# Patient Record
Sex: Male | Born: 1980 | Race: White | Hispanic: No | Smoking: Current every day smoker
Health system: Southern US, Community
[De-identification: ages and names within clinical notes are randomized; demographics above are authoritative.]

## PROBLEM LIST (undated history)

## (undated) DIAGNOSIS — G009 Bacterial meningitis, unspecified: Secondary | ICD-10-CM

---

## 2006-03-25 ENCOUNTER — Emergency Department (HOSPITAL_COMMUNITY): Admission: EM | Admit: 2006-03-25 | Discharge: 2006-03-25 | Payer: Self-pay | Admitting: Emergency Medicine

## 2008-07-04 ENCOUNTER — Inpatient Hospital Stay (HOSPITAL_COMMUNITY): Admission: EM | Admit: 2008-07-04 | Discharge: 2008-07-07 | Payer: Self-pay | Admitting: Emergency Medicine

## 2008-07-05 ENCOUNTER — Ambulatory Visit: Payer: Self-pay | Admitting: Infectious Disease

## 2009-11-03 ENCOUNTER — Inpatient Hospital Stay (HOSPITAL_COMMUNITY): Admission: EM | Admit: 2009-11-03 | Discharge: 2009-11-05 | Payer: Self-pay | Admitting: Emergency Medicine

## 2010-01-25 ENCOUNTER — Emergency Department (HOSPITAL_BASED_OUTPATIENT_CLINIC_OR_DEPARTMENT_OTHER)
Admission: EM | Admit: 2010-01-25 | Discharge: 2010-01-25 | Payer: Self-pay | Source: Home / Self Care | Admitting: Emergency Medicine

## 2010-04-14 LAB — URINALYSIS, ROUTINE W REFLEX MICROSCOPIC
Bilirubin Urine: NEGATIVE
Hgb urine dipstick: NEGATIVE
Nitrite: NEGATIVE
Protein, ur: NEGATIVE mg/dL
Specific Gravity, Urine: 1.019 (ref 1.005–1.030)
Urobilinogen, UA: 1 mg/dL (ref 0.0–1.0)

## 2010-04-14 LAB — URINE CULTURE
Colony Count: NO GROWTH
Culture: NO GROWTH
Special Requests: NEGATIVE

## 2010-04-14 LAB — COMPREHENSIVE METABOLIC PANEL
AST: 13 U/L (ref 0–37)
AST: 23 U/L (ref 0–37)
Albumin: 3.7 g/dL (ref 3.5–5.2)
BUN: 12 mg/dL (ref 6–23)
BUN: 9 mg/dL (ref 6–23)
CO2: 25 mEq/L (ref 19–32)
Calcium: 8.9 mg/dL (ref 8.4–10.5)
Chloride: 109 mEq/L (ref 96–112)
Creatinine, Ser: 0.68 mg/dL (ref 0.4–1.5)
Creatinine, Ser: 0.71 mg/dL (ref 0.4–1.5)
GFR calc Af Amer: >60 mL/min (ref >60)
GFR calc Af Amer: >60 mL/min (ref >60)
GFR calc non Af Amer: >60 mL/min (ref >60)
Glucose, Bld: 180 mg/dL — ABNORMAL HIGH (ref 70–99)
Total Bilirubin: 0.6 mg/dL (ref 0.3–1.2)
Total Protein: 6.4 g/dL (ref 6.0–8.3)

## 2010-04-14 LAB — BASIC METABOLIC PANEL
CO2: 25 mEq/L (ref 19–32)
Chloride: 109 mEq/L (ref 96–112)
Creatinine, Ser: 0.75 mg/dL (ref 0.4–1.5)
GFR calc Af Amer: >60 mL/min (ref >60)
Potassium: 3.9 mEq/L (ref 3.5–5.1)
Sodium: 140 mEq/L (ref 135–145)

## 2010-04-14 LAB — GRAM STAIN

## 2010-04-14 LAB — CULTURE, BLOOD (ROUTINE X 2)
Culture  Setup Time: 201110052138
Culture  Setup Time: 201110052139
Culture: NO GROWTH

## 2010-04-14 LAB — CSF CELL COUNT WITH DIFFERENTIAL: Segmented Neutrophils-CSF: 8 % — ABNORMAL HIGH (ref 0–6)

## 2010-04-14 LAB — CBC
HCT: 43.2 % (ref 39.0–52.0)
HCT: 47.2 % (ref 39.0–52.0)
Hemoglobin: 15.1 g/dL (ref 13.0–17.0)
Hemoglobin: 16.5 g/dL (ref 13.0–17.0)
MCH: 32.6 pg (ref 26.0–34.0)
MCH: 32.7 pg (ref 26.0–34.0)
MCH: 32.8 pg (ref 26.0–34.0)
MCV: 92.9 fL (ref 78.0–100.0)
MCV: 93.4 fL (ref 78.0–100.0)
MCV: 93.6 fL (ref 78.0–100.0)
Platelets: 231 10*3/uL (ref 150–400)
RBC: 4.63 MIL/uL (ref 4.22–5.81)
RBC: 5.08 MIL/uL (ref 4.22–5.81)
RDW: 13.6 % (ref 11.5–15.5)
WBC: 9.8 10*3/uL (ref 4.0–10.5)

## 2010-04-14 LAB — DIFFERENTIAL
Basophils Absolute: 0.1 10*3/uL (ref 0.0–0.1)
Eosinophils Absolute: 0 10*3/uL (ref 0.0–0.7)
Eosinophils Relative: 0 % (ref 0–5)
Eosinophils Relative: 0 % (ref 0–5)
Lymphocytes Relative: 10 % — ABNORMAL LOW (ref 12–46)
Lymphocytes Relative: 7 % — ABNORMAL LOW (ref 12–46)
Lymphs Abs: 1 10*3/uL (ref 0.7–4.0)
Monocytes Absolute: 0 10*3/uL — ABNORMAL LOW (ref 0.1–1.0)
Monocytes Relative: 0 % — ABNORMAL LOW (ref 3–12)
Neutrophils Relative %: 92 % — ABNORMAL HIGH (ref 43–77)

## 2010-04-14 LAB — CSF CULTURE W GRAM STAIN

## 2010-04-14 LAB — PROTEIN AND GLUCOSE, CSF: Glucose, CSF: 51 mg/dL (ref 43–76)

## 2010-04-14 LAB — MAGNESIUM: Magnesium: 2.4 mg/dL (ref 1.5–2.5)

## 2010-05-09 LAB — CULTURE, BLOOD (ROUTINE X 2): Culture: NO GROWTH

## 2010-05-09 LAB — BASIC METABOLIC PANEL
BUN: 9 mg/dL (ref 6–23)
CO2: 27 mEq/L (ref 19–32)
Chloride: 103 mEq/L (ref 96–112)
GFR calc Af Amer: >60 mL/min (ref >60)
GFR calc non Af Amer: >60 mL/min (ref >60)
Glucose, Bld: 109 mg/dL — ABNORMAL HIGH (ref 70–99)
Glucose, Bld: 95 mg/dL (ref 70–99)
Potassium: 3.6 mEq/L (ref 3.5–5.1)
Potassium: 4.3 mEq/L (ref 3.5–5.1)
Sodium: 138 mEq/L (ref 135–145)

## 2010-05-09 LAB — CBC
HCT: 40 % (ref 39.0–52.0)
Hemoglobin: 14 g/dL (ref 13.0–17.0)
MCHC: 34 g/dL (ref 30.0–36.0)
MCHC: 34.9 g/dL (ref 30.0–36.0)
Platelets: 243 10*3/uL (ref 150–400)
RBC: 4.35 MIL/uL (ref 4.22–5.81)
RBC: 5.05 MIL/uL (ref 4.22–5.81)
RDW: 13.2 % (ref 11.5–15.5)

## 2010-05-09 LAB — DIFFERENTIAL
Basophils Absolute: 0 10*3/uL (ref 0.0–0.1)
Basophils Relative: 0 % (ref 0–1)
Monocytes Relative: 5 % (ref 3–12)
Neutro Abs: 10.6 10*3/uL — ABNORMAL HIGH (ref 1.7–7.7)
Neutrophils Relative %: 82 % — ABNORMAL HIGH (ref 43–77)

## 2010-05-09 LAB — CSF CELL COUNT WITH DIFFERENTIAL
RBC Count, CSF: 118 /mm3 — ABNORMAL HIGH
WBC, CSF: 643 /mm3 — ABNORMAL HIGH (ref 0–5)

## 2010-05-09 LAB — PROTEIN AND GLUCOSE, CSF
Glucose, CSF: 56 mg/dL (ref 43–76)
Total  Protein, CSF: 158 mg/dL — ABNORMAL HIGH (ref 15–45)

## 2010-05-09 LAB — BODY FLUID CULTURE

## 2010-05-09 LAB — URINALYSIS, ROUTINE W REFLEX MICROSCOPIC
Nitrite: NEGATIVE
Specific Gravity, Urine: 1.027 (ref 1.005–1.030)
Urobilinogen, UA: 1 mg/dL (ref 0.0–1.0)

## 2010-06-14 NOTE — H&P (Signed)
NAMEZEREK, LITSEY NO.:  [id number]   MEDICAL RECORD NO.:  [id number]          PATIENT TYPE:  INP   LOCATION:  0108                         FACILITY:  [facility]   PHYSICIAN:  Della Goo, M.D. DATE OF BIRTH:  Dec 14, 1980   DATE OF ADMISSION:  07/04/2008  DATE OF DISCHARGE:                              HISTORY & PHYSICAL   PRIMARY CARE PHYSICIAN:  Unassigned.   CHIEF COMPLAINT:  Headache, fever.   HISTORY OF PRESENT ILLNESS:  This is a 30 year old male who presents to  the emergency department with complaints of severe headache which  started 1 day ago in the a.m.  The patient reports having a severe  constant headache and also having nausea, vomiting and neck stiffness.  The patient reports having fevers, chills.  He reports that the symptoms  are similar to when he had spinal meningitis in February 2008.  He  stated at that time this was bacterial meningitis and he had a rash.  Currently he denies having any rash.   The patient was evaluated in the emergency department and the EDP  performed a lumbar puncture, results of which returned most consistent  with a viral meningitis.  The patient was started on therapy to cover  both bacterial and viral meningitis, and then he was referred for  admission.   PAST MEDICAL HISTORY:  As mentioned above.  Also history of  gastroesophageal reflux disease.   PAST SURGICAL HISTORY:  None.   MEDICATIONS:  Over-the-counter medications.   ALLERGIES:  No known drug allergies.   SOCIAL HISTORY:  The patient is a smoker.  He smokes 1 pack of  cigarettes daily for 10 years.  He also drinks occasionally.  He states  he drinks 8-9 beers once a week.  He denies any illicit drug usage.   FAMILY HISTORY:  Noncontributory.   REVIEW OF SYSTEMS:  Pertinents are mentioned above.  All other organ  systems are negative.   PHYSICAL EXAMINATION FINDINGS:  GENERAL APPEARANCE: This is a 28-year-  old morbidly obese male in  discomfort but no acute distress.  VITAL  SIGNS:  Temperature 99.6, blood pressure 149/103, heart rate 86,  respirations 16, O2 saturations 98%.  HEENT: Normocephalic, atraumatic.  Pupils equally round and reactive to  light.  Extraocular movements are intact. Funduscopic benign.  There is  no scleral icterus.  Nares are patent bilaterally.  Oropharynx is clear.  Neck is supple with full range of motion.  No thyromegaly, adenopathy or  jugular venous distention.  No meningismus on examination.  CARDIOVASCULAR:  Regular rate and rhythm.  No murmurs, gallops or rubs.  LUNGS: Clear to auscultation bilaterally.  ABDOMEN: Positive bowel sounds, soft, nontender, nondistended.  No  hepatosplenomegaly.  EXTREMITIES: Without cyanosis, clubbing or edema.  NEUROLOGIC:  The patient is alert and oriented x3.  Cranial nerves are  intact.  Motor and sensory function also intact.  The patient has 5/5  strength and full range of motion throughout.   LABORATORY STUDIES:  White blood cell count 12.9, hemoglobin 16,  hematocrit 46.9, platelets 243, neutrophils 82%, lymphocytes 13%.  Sodium 136, potassium 3.6, chloride 103, carbon dioxide 24, BUN 9,  creatinine 0.86 and glucose 109.  Urinalysis reveals small leukocyte  esterase and CSF fluid reveals an appearance which is clear.  The RBCs  are 118.  White blood cells are 643, neutrophils 0%, lymphocytes and  99%. Total CSF protein 158.  CSF glucose level 56.   ASSESSMENT:  This is a 30 year old male being admitted with  1. Viral meningitis.  2. Severe cephalgia.  3. Febrile illness secondary viral meningitis.  4. Nausea and vomiting secondary viral meningitis.   PLAN:  The patient will be admitted to an isolation area.  He has been  placed on empiric antibiotic and antiviral medication therapy to cover  both viral and bacterial meningitis at this time.  The patient will also  be placed on IV fluids.  Antiemetics and pain control therapy have been   ordered.  The patient will be placed on a clear liquid diet for now and  this will advance as tolerated.  The patient will also be placed on DVT  and GI prophylaxis and further workup will ensue pending results of the  patient's clinical course.      Della Goo, M.D.  Electronically Signed     HJ/MEDQ  D:  07/05/2008  T:  07/05/2008  Job:  [id number]

## 2010-06-14 NOTE — Consult Note (Signed)
NAMEHAITHAM, [name]:  [id number]   MEDICAL RECORD NO.:  [id number]          PATIENT TYPE:  INP   LOCATION:  1504                         FACILITY:  [facility]   PHYSICIAN:  Lee Lav, MD  DATE OF BIRTH:  [birth year]   DATE OF CONSULTATION:  07/05/2008  DATE OF DISCHARGE:                                 CONSULTATION   REQUESTING PHYSICIAN:  Lee Anderson, M.D.   REASON FOR INFECTIOUS DISEASE CONSULTATION:  Lymphocytic meningitis.   HISTORY OF PRESENT ILLNESS:  Lee Anderson is a 30 year old Caucasian  gentleman with a past medical history significant for apparent aseptic  meningitis in 2008.  During that year he apparently had broken out with  genital herpes and was admitted with severe headaches and meningismus to  [facility].  He states that he was diagnosed with  spinal meningitis at that point in time.  He has had recurrent flares  of genital herpes at least five times since then.  His wife also  subsequently in this same time period had developed genital herpes as  well.  She takes chronic Valtrex for suppression.  He has tried to take  acyclovir for treatment but has not been able to take it for suppression  due to lack of insurance and cost.  He presented to the emergency  department on June 5 with severe headaches, stiff neck, nausea, vomiting  with bilious vomiting with fevers and chills.  He felt these symptoms  were highly similar to that which he experienced when he developed  spinal meningitis.  The patient was evaluated in the emergency  department, underwent lumbar puncture.  His lumbar puncture revealed a  cell count showing 643 white blood cells, 118 red blood cells with 99%  lymphocytes, one monocyte and no neutrophils.  Protein was 158, glucose  was 56.  Gram stain does not appear to have been done.  Culture is in  progress.  I have called the microbiology lab to try and see what the  status of this is.  Urinalysis  showed 3-6 white blood cells, 0-2 red  blood cells.  The remainder of his labs were significant also for a  white blood cell count of 12,900, hematocrit 16, platelets of 243.  The  patient was admitted to the hospitalist service and started on  vancomycin, ceftriaxone and acyclovir.   SOCIAL HISTORY:  Significant for him being married, for him having had  the prior herpes infection.  He lives at home with his wife and child  who is two years old.  The child has not been ill nor has the patient  had any other ill contacts.  The patient's travel history is significant  only for travel on the [facility].  He has never been exposed to  anyone with tuberculosis.   PAST MEDICAL HISTORY:  Gastroesophageal reflux disease which he has been  managed with over-the-counter Prilosec.  Also has responded to Pepcid.   PAST SURGICAL HISTORY:  None.   FAMILY HISTORY:  Noncontributory.   SOCIAL HISTORY:  The patient smokes one pack per day.  He drinks  occasionally.  He has used marijuana but no other illicit drug use.   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  Chest pain is substernal accompanied by  dyspnea, not by nausea, does not radiate.  Chest pain dissipates  typically in 5 minutes.  He has had several of these in the last several  weeks and months but has not had it this week or during this admission.  Pertinent for he fact that he has had recent fevers and chills,  headaches as described in the paragraph above.  He has not had chronic  fevers or weight loss or fevers or chills.  The remainder of 10-point  review of systems is negative.   ALLERGIES:  No known drug allergies.   CURRENT MEDICATIONS:  1. Acyclovir 820 mg every 8 hours.  2. Ceftriaxone 2 grams every 12 hours.  3. Vancomycin 1750 mg every 8 hours.  4. Lovenox, pantoprazole, senna, docusate and  Tylenol, Bisodol,      Dilaudid, Zofran, oxycodone.   PHYSICAL EXAMINATION:  Temperature maximum overnight 99.4, blood  pressure 143/74, pulse 82, respirations 20, pulse ox 90% room air.  Weight 122 kg.  GENERAL:  Quite pleasant gentleman lying in bed.  He has just received  another dose of Dilaudid.  HEENT:  Normocephalic, atraumatic.  Pupils equal, round, reactive to  light.  Sclerae anicteric.  Oropharynx clear.  CARDIOVASCULAR EXAM:  Regular rate and rhythm.  No murmurs, gallops or  rubs.  LUNGS:  Clear to auscultation bilaterally without wheeze, rhonchi or  rales.  ABDOMEN:  Soft, nondistended, nontender.  EXTREMITIES:  Without edema  NEUROLOGIC EXAM:  Nonfocal.  The patient has no evidence of meningismus  at this point time.   LABORATORY DATA:  Spinal fluid analysis as described above.  White blood  cell count 12.9, hemoglobin 16, platelets 243.  Metabolic panel:  Sodium  136, potassium 3.6, chloride 103, bicarb 24.  BUN and creatinine of 9  and 0.86, glucose 109.   IMPRESSION/RECOMMENDATIONS:  This is a 30 year old Caucasian gentleman  past medical history significant for gastroesophageal reflux disease,  genital herpes and past apparent aseptic meningitis in 2008 now with an  apparent recurrence of aseptic meningitis with the patient having a  lymphocytic predominant meningitis.  1. Lymphocytic meningitis:  Differential for lymphocytic meningitis is      largely viral.  Highest in the differential would be herpes simplex      type 2 meningitis.  The patient has no evidence of encephalitis and      herpes simplex type 1 is less likely.  Additionally enterovirus      could cause aseptic meningitis with lymphocytic predominance as      could West Nile wires.  Tuberculous meningitis can cause an aseptic      meningitis, although his clinical picture is not consistent with      this.  Certainly other fungal infections such as Cryptococcus histo      could do it but he is  essentially the wrong host for this (We will      check an HIV test though).  However, syphilis could also do this.      We will check an RPR.  Of the bacterial pathogens that can cause a      lymphocytic predominant spinal fluid profile is Listeria  monocytogenes.  Again the patient seems to be the wrong demographic      for this.  He is young, healthy, at least to the best of our      knowledge and through reviewing his history he does eat food such      as hot dogs that are known to have Listeria but he is not      immunocompromised and seems highly unlikely to have this.  At      present I am going to stop his vancomycin and ceftriaxone.  We will      continue his acyclovir.  I will put him on ampicillin until we can      get more data from the CSF cultures.  It is quite reasonable to      check HSV-2 from his spinal fluid.  I would check an RPR on his      blood and an HIV test.  If the patient's spinal fluid cultures      remain negative at 24 hours after admission I will discontinue his      droplet precautions (The possibility of Meningococcal meningitis is      next to nothing.).  Should his spinal fluid cultures remain      negative, I would discontinue all antibacterial antibiotics.  Once      his nausea is controlled, I would put him on Valtrex at 1 gram      three times daily and complete a 7-10 day course of Valtrex.  At      that point in time I would put him on suppressive acyclovir which      could be taken at 400 mg twice daily.  Note there is controversy as      to whether suppressive acyclovir or Valtrex is useful for recurrent      Mallory's meningitis.  However, this patient also has had history      of recurrent genital herpes infections and I feel that this would      be beneficial to him to take prophylactic acyclovir.  2. Chest pain.  I will check an electrocardiogram.  He does not have      current chest pain but I feel it is  worthwhile to check an       electrogram and I will let Dr. Darnelle Catalan know about this.  3. Proton pump inhibitor use.  The patient has gastroesophageal reflux      disease that has responded to proton pump inhibitors as an      outpatient.  His ulcer responded to Pepcid.  Pepcid would be      cheaper for the patient and also reduce the risk of him acquiring a      healthcare associated pneumonia with initiation of a proton pump      inhibitor in the hospital.  4. Thank you for this fascinating consultation.  Dr. Orvan Falconer will be      here tomorrow to see the patient again.      Lee Lav, MD  Electronically Signed     CV/MEDQ  D:  07/05/2008  T:  07/05/2008  Job:  [id number]

## 2010-06-14 NOTE — Discharge Summary (Signed)
Lee Anderson, KROENING NO.:  [id number]   MEDICAL RECORD NO.:  [id number]          PATIENT TYPE:  INP   LOCATION:  1504                         FACILITY:  [facility]   PHYSICIAN:  Lee Anderson, M.D.   DATE OF BIRTH:  January 21, 1981   DATE OF ADMISSION:  07/04/2008  DATE OF DISCHARGE:  07/07/2008                               DISCHARGE SUMMARY   PRIMARY CARE PHYSICIAN:  None.   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 Intractable headache secondary to number one.  3. Gastroesophageal reflux disease.  4. Genital herpes.   DISCHARGE MEDICATIONS:  1. Valtrex 1 gram p.o. t.i.d. times 1 week.  2. Percocet 10/625 one tablet q.4 hours p.r.n. headache.  3. Phenergan 25 mg p.o. q.4 hours p.r.n. nausea.  4. Ibuprofen 400 mg p.o. q.4 hours p.r.n. headache.   CONSULTATIONS:  Dr. Daiva Eves and Dr. Orvan Falconer of infectious diseases.   BRIEF ADMISSION HISTORY OF PRESENT ILLNESS:  The patient is a 28-year-  old male with past medical history of herpes simplex virus type 2  meningitis treated in January 2008.  Hospital records were obtained from  [facility] and this history was confirmed.  The patient  presented to the hospital with chief complaint of fever and headaches.  The patient was admitted with a diagnosis of acute meningitis and  hospital service was subsequently called for management.  For full  details, please see the dictated report done by Dr. Lovell Sheehan.   PROCEDURES AND DIAGNOSTIC STUDIES:  Lumbar puncture done on admission  revealed a white blood cell count of 643, red blood cells of 118 and 99%  lymphocytes.  Color was clear.  Protein was elevated at 158.  Glucose  was 56.  Herpes simplex virus II DNA was detected.   DISCHARGE LABORATORY VALUES:  HIV testing was negative.  Syphilis screen  was negative.  CSF cultures have shown no growth x2 days.   HOSPITAL COURSE BY PROBLEM:  1. Recurrent herpes simplex  virus 2 meningitis:  The patient was      admitted and due to concerns of possible bacterial meningitis, was      broadly covered with vancomycin, acyclovir, and Rocephin.      Ampicillin was subsequently added to cover Listeria although this      was considered less likely underlying etiology.  The patient's past      medical history was confirmed when records were obtained from [facility] which confirmed a previous history of herpes      simplex 2 viral meningitis.  The patient underwent a lumbar      puncture which confirmed recurrent herpes simplex virus 2      meningitis.  Once the CSF cultures remained negative for 2 days,      antibiotics were discontinued and he was transitioned over p.o.      Valtrex.  Infectious disease consultation was obtained for      treatment recommendations and ultimately it was recommended that      the patient remain  on Valtrex for 1 more week.  After discussion of      the pros and cons of long-term prophylactic therapy with acyclovir,      the patient declined this due to lack of health care insurance and      resources to pay for prophylactic therapy.  He understands that he      should come to the hospital for recurrence of headaches.  2. Intractable headache:  This is due to problem number one.  The      patient was treated with IV Dilaudid while in the hospital.  He      will be discharged on Percocet for pain control.  3. Gastroesophageal reflux disease:  The patient was initially put on      Protonix.  His therapy was downgraded to Pepcid prior to discharge.      He is to continue to use over-the-counter medications as needed      after discharge.  4. Genital herpes:  Again, the patient will be treated with Valtrex as      noted above.   DISPOSITION:  The patient is medically stable and be discharged home.  He is encouraged to call [phone], the physician's referral, line to  obtain a primary care physician and to follow up  as needed.   Time spent coordinating care for discharge and discharge instructions  equals 25 minutes.      Lee Anderson, M.D.  Electronically Signed     CR/MEDQ  D:  07/07/2008  T:  07/07/2008  Job:  [id number]

## 2010-06-30 ENCOUNTER — Emergency Department (INDEPENDENT_AMBULATORY_CARE_PROVIDER_SITE_OTHER): Payer: Self-pay

## 2010-06-30 ENCOUNTER — Emergency Department (HOSPITAL_BASED_OUTPATIENT_CLINIC_OR_DEPARTMENT_OTHER)
Admission: EM | Admit: 2010-06-30 | Discharge: 2010-07-01 | Disposition: A | Discharge status: Home / Self Care | Payer: Self-pay | Attending: Emergency Medicine | Admitting: Emergency Medicine

## 2010-06-30 DIAGNOSIS — K219 Gastro-esophageal reflux disease without esophagitis: Secondary | ICD-10-CM | POA: Insufficient documentation

## 2010-06-30 DIAGNOSIS — R0789 Other chest pain: Secondary | ICD-10-CM

## 2010-06-30 DIAGNOSIS — W19XXXA Unspecified fall, initial encounter: Secondary | ICD-10-CM

## 2010-06-30 DIAGNOSIS — S2249XA Multiple fractures of ribs, unspecified side, initial encounter for closed fracture: Secondary | ICD-10-CM | POA: Insufficient documentation

## 2010-06-30 DIAGNOSIS — S0003XA Contusion of scalp, initial encounter: Secondary | ICD-10-CM | POA: Insufficient documentation

## 2010-06-30 DIAGNOSIS — M542 Cervicalgia: Secondary | ICD-10-CM

## 2010-06-30 DIAGNOSIS — Y93H9 Activity, other involving exterior property and land maintenance, building and construction: Secondary | ICD-10-CM | POA: Insufficient documentation

## 2010-06-30 DIAGNOSIS — W208XXA Other cause of strike by thrown, projected or falling object, initial encounter: Secondary | ICD-10-CM | POA: Insufficient documentation

## 2010-06-30 DIAGNOSIS — S20219A Contusion of unspecified front wall of thorax, initial encounter: Secondary | ICD-10-CM

## 2010-06-30 MED ORDER — IOHEXOL 300 MG/ML  SOLN
80.0000 mL | Freq: Once | INTRAMUSCULAR | Status: AC | PRN
  Administered 2010-06-30: 80 mL via INTRAVENOUS

## 2010-07-04 ENCOUNTER — Emergency Department (HOSPITAL_BASED_OUTPATIENT_CLINIC_OR_DEPARTMENT_OTHER)
Admission: EM | Admit: 2010-07-04 | Discharge: 2010-07-04 | Disposition: A | Discharge status: Home / Self Care | Payer: Self-pay | Attending: Emergency Medicine | Admitting: Emergency Medicine

## 2010-07-04 DIAGNOSIS — IMO0002 Reserved for concepts with insufficient information to code with codable children: Secondary | ICD-10-CM | POA: Insufficient documentation

## 2010-07-04 DIAGNOSIS — Z760 Encounter for issue of repeat prescription: Secondary | ICD-10-CM | POA: Insufficient documentation

## 2013-01-24 ENCOUNTER — Inpatient Hospital Stay (HOSPITAL_COMMUNITY)
Admission: EM | Admit: 2013-01-24 | Discharge: 2013-01-25 | DRG: 076 | Disposition: A | Discharge status: Home / Self Care | Payer: Self-pay | Attending: Internal Medicine | Admitting: Internal Medicine

## 2013-01-24 ENCOUNTER — Encounter (HOSPITAL_COMMUNITY): Payer: Self-pay | Admitting: Emergency Medicine

## 2013-01-24 DIAGNOSIS — G032 Benign recurrent meningitis [Mollaret]: Secondary | ICD-10-CM | POA: Diagnosis present

## 2013-01-24 DIAGNOSIS — Z8661 Personal history of infections of the central nervous system: Secondary | ICD-10-CM

## 2013-01-24 DIAGNOSIS — B003 Herpesviral meningitis: Principal | ICD-10-CM | POA: Diagnosis present

## 2013-01-24 DIAGNOSIS — A6000 Herpesviral infection of urogenital system, unspecified: Secondary | ICD-10-CM | POA: Diagnosis present

## 2013-01-24 DIAGNOSIS — Z791 Long term (current) use of non-steroidal anti-inflammatories (NSAID): Secondary | ICD-10-CM

## 2013-01-24 DIAGNOSIS — R519 Headache, unspecified: Secondary | ICD-10-CM

## 2013-01-24 DIAGNOSIS — R51 Headache: Secondary | ICD-10-CM

## 2013-01-24 DIAGNOSIS — G039 Meningitis, unspecified: Secondary | ICD-10-CM

## 2013-01-24 DIAGNOSIS — F17200 Nicotine dependence, unspecified, uncomplicated: Secondary | ICD-10-CM | POA: Diagnosis present

## 2013-01-24 HISTORY — DX: Bacterial meningitis, unspecified: G00.9

## 2013-01-24 LAB — BASIC METABOLIC PANEL
BUN: 9 mg/dL (ref 6–23)
Chloride: 100 mEq/L (ref 96–112)
Creatinine, Ser: 0.82 mg/dL (ref 0.50–1.35)
GFR calc Af Amer: >90 mL/min (ref >90)
Glucose, Bld: 119 mg/dL — ABNORMAL HIGH (ref 70–99)
Potassium: 4.1 mEq/L (ref 3.5–5.1)

## 2013-01-24 LAB — CBC
HCT: 48.1 % (ref 39.0–52.0)
Hemoglobin: 17.2 g/dL — ABNORMAL HIGH (ref 13.0–17.0)
MCV: 92.3 fL (ref 78.0–100.0)
RDW: 12.6 % (ref 11.5–15.5)
WBC: 11.1 10*3/uL — ABNORMAL HIGH (ref 4.0–10.5)

## 2013-01-24 MED ORDER — ONDANSETRON 8 MG PO TBDP
8.0000 mg | ORAL_TABLET | Freq: Once | ORAL | Status: AC
  Administered 2013-01-24: 8 mg via ORAL
  Filled 2013-01-24: qty 1

## 2013-01-24 NOTE — ED Notes (Signed)
Pt reports that he has had a HA since yesterday, with nausea, began vomiting today, reports hx of meningitis that feels like this, states he otherwise does not have headaches, has been taking Ibuprofen without relief since yesterday, last taken at 1400. Pt reports being unable to keep food or fluids down at this time.

## 2013-01-25 ENCOUNTER — Emergency Department (HOSPITAL_COMMUNITY): Payer: Self-pay

## 2013-01-25 DIAGNOSIS — R519 Headache, unspecified: Secondary | ICD-10-CM

## 2013-01-25 DIAGNOSIS — G032 Benign recurrent meningitis [Mollaret]: Secondary | ICD-10-CM | POA: Diagnosis present

## 2013-01-25 DIAGNOSIS — A879 Viral meningitis, unspecified: Secondary | ICD-10-CM

## 2013-01-25 DIAGNOSIS — A6000 Herpesviral infection of urogenital system, unspecified: Secondary | ICD-10-CM | POA: Diagnosis present

## 2013-01-25 DIAGNOSIS — B003 Herpesviral meningitis: Principal | ICD-10-CM

## 2013-01-25 DIAGNOSIS — G039 Meningitis, unspecified: Secondary | ICD-10-CM

## 2013-01-25 DIAGNOSIS — R51 Headache: Secondary | ICD-10-CM

## 2013-01-25 LAB — CSF CELL COUNT WITH DIFFERENTIAL
Lymphs, CSF: 86 % — ABNORMAL HIGH (ref 40–80)
Lymphs, CSF: 90 % — ABNORMAL HIGH (ref 40–80)
Monocyte-Macrophage-Spinal Fluid: 11 % — ABNORMAL LOW (ref 15–45)
Monocyte-Macrophage-Spinal Fluid: 8 % — ABNORMAL LOW (ref 15–45)
RBC Count, CSF: 0 /mm3
Segmented Neutrophils-CSF: 3 % (ref 0–6)
Tube #: 4
WBC, CSF: 310 /mm3 (ref 0–5)

## 2013-01-25 LAB — COMPREHENSIVE METABOLIC PANEL
AST: 27 U/L (ref 0–37)
Albumin: 4 g/dL (ref 3.5–5.2)
BUN: 10 mg/dL (ref 6–23)
Calcium: 9.6 mg/dL (ref 8.4–10.5)
Chloride: 97 mEq/L (ref 96–112)
Creatinine, Ser: 0.81 mg/dL (ref 0.50–1.35)
GFR calc Af Amer: >90 mL/min (ref >90)
GFR calc non Af Amer: >90 mL/min (ref >90)
Total Bilirubin: 0.6 mg/dL (ref 0.3–1.2)

## 2013-01-25 LAB — CBC WITH DIFFERENTIAL/PLATELET
Basophils Absolute: 0 10*3/uL (ref 0.0–0.1)
Basophils Relative: 0 % (ref 0–1)
Eosinophils Absolute: 0 10*3/uL (ref 0.0–0.7)
Eosinophils Relative: 0 % (ref 0–5)
HCT: 47.5 % (ref 39.0–52.0)
Hemoglobin: 16.8 g/dL (ref 13.0–17.0)
MCH: 32.7 pg (ref 26.0–34.0)
MCHC: 35.4 g/dL (ref 30.0–36.0)
MCV: 92.6 fL (ref 78.0–100.0)
Monocytes Absolute: 0.5 10*3/uL (ref 0.1–1.0)
Monocytes Relative: 4 % (ref 3–12)
RDW: 12.8 % (ref 11.5–15.5)

## 2013-01-25 LAB — POCT I-STAT, CHEM 8
BUN: 9 mg/dL (ref 6–23)
Calcium, Ion: 1.08 mmol/L — ABNORMAL LOW (ref 1.12–1.23)
Chloride: 103 mEq/L (ref 96–112)
HCT: 52 % (ref 39.0–52.0)
Hemoglobin: 17.7 g/dL — ABNORMAL HIGH (ref 13.0–17.0)
Sodium: 137 mEq/L (ref 135–145)
TCO2: 22 mmol/L (ref 0–100)

## 2013-01-25 LAB — PROTEIN, CSF: Total  Protein, CSF: 136 mg/dL — ABNORMAL HIGH (ref 15–45)

## 2013-01-25 LAB — GRAM STAIN

## 2013-01-25 LAB — GLUCOSE, CSF: Glucose, CSF: 66 mg/dL (ref 43–76)

## 2013-01-25 MED ORDER — INFLUENZA VAC SPLIT QUAD 0.5 ML IM SUSP: 0.5000 mL | INTRAMUSCULAR | Status: DC

## 2013-01-25 MED ORDER — DIPHENHYDRAMINE HCL 50 MG/ML IJ SOLN
25.0000 mg | Freq: Once | INTRAMUSCULAR | Status: AC
  Administered 2013-01-25: 25 mg via INTRAVENOUS
  Filled 2013-01-25: qty 1

## 2013-01-25 MED ORDER — PNEUMOCOCCAL VAC POLYVALENT 25 MCG/0.5ML IJ INJ: 0.5000 mL | INJECTION | INTRAMUSCULAR | Status: DC

## 2013-01-25 MED ORDER — VALACYCLOVIR HCL 1 G PO TABS: 1000.0000 mg | ORAL_TABLET | Freq: Three times a day (TID) | ORAL | Status: DC

## 2013-01-25 MED ORDER — HYDROMORPHONE HCL PF 1 MG/ML IJ SOLN: 1.0000 mg | INTRAMUSCULAR | Status: DC | PRN

## 2013-01-25 MED ORDER — VANCOMYCIN HCL 10 G IV SOLR
1250.0000 mg | Freq: Two times a day (BID) | INTRAVENOUS | Status: DC
  Filled 2013-01-25 (×2): qty 1250

## 2013-01-25 MED ORDER — DEXTROSE 5 % IV SOLN
820.0000 mg | Freq: Three times a day (TID) | INTRAVENOUS | Status: DC
  Filled 2013-01-25 (×2): qty 16.4

## 2013-01-25 MED ORDER — DEXTROSE 5 % IV SOLN
2.0000 g | Freq: Once | INTRAVENOUS | Status: AC
  Administered 2013-01-25: 2 g via INTRAVENOUS
  Filled 2013-01-25: qty 2

## 2013-01-25 MED ORDER — DEXTROSE 5 % IV SOLN
1000.0000 mg | Freq: Three times a day (TID) | INTRAVENOUS | Status: DC
  Filled 2013-01-25 (×2): qty 20

## 2013-01-25 MED ORDER — VALACYCLOVIR HCL 500 MG PO TABS
1000.0000 mg | ORAL_TABLET | Freq: Three times a day (TID) | ORAL | Status: DC
  Administered 2013-01-25 (×2): 1000 mg via ORAL
  Filled 2013-01-25 (×3): qty 2

## 2013-01-25 MED ORDER — OXYCODONE HCL 5 MG PO CAPS: 5.0000 mg | ORAL_CAPSULE | ORAL | Status: DC | PRN

## 2013-01-25 MED ORDER — CEFTRIAXONE SODIUM 2 G IJ SOLR: 2.0000 g | Freq: Two times a day (BID) | INTRAMUSCULAR | Status: DC

## 2013-01-25 MED ORDER — SODIUM CHLORIDE 0.9 % IV SOLN
INTRAVENOUS | Status: DC
  Administered 2013-01-25: 07:00:00 via INTRAVENOUS

## 2013-01-25 MED ORDER — HYDROMORPHONE HCL PF 1 MG/ML IJ SOLN
0.5000 mg | INTRAMUSCULAR | Status: DC | PRN
  Administered 2013-01-25 (×2): 1 mg via INTRAVENOUS
  Filled 2013-01-25 (×2): qty 1

## 2013-01-25 MED ORDER — DEXTROSE 5 % IV SOLN
1000.0000 mg | Freq: Once | INTRAVENOUS | Status: AC
  Administered 2013-01-25: 1000 mg via INTRAVENOUS
  Filled 2013-01-25: qty 20

## 2013-01-25 MED ORDER — DEXAMETHASONE SODIUM PHOSPHATE 4 MG/ML IJ SOLN: 5.0000 mg | Freq: Once | INTRAMUSCULAR | Status: DC

## 2013-01-25 MED ORDER — SODIUM CHLORIDE 0.9 % IV BOLUS (SEPSIS)
1000.0000 mL | Freq: Once | INTRAVENOUS | Status: AC
  Administered 2013-01-25: 1000 mL via INTRAVENOUS

## 2013-01-25 MED ORDER — METOCLOPRAMIDE HCL 5 MG/ML IJ SOLN
10.0000 mg | Freq: Once | INTRAMUSCULAR | Status: AC
  Administered 2013-01-25: 10 mg via INTRAVENOUS
  Filled 2013-01-25: qty 2

## 2013-01-25 MED ORDER — DEXTROSE 5 % IV SOLN
2.0000 g | Freq: Two times a day (BID) | INTRAVENOUS | Status: DC
  Filled 2013-01-25: qty 2

## 2013-01-25 MED ORDER — VANCOMYCIN HCL IN DEXTROSE 1-5 GM/200ML-% IV SOLN
1000.0000 mg | Freq: Once | INTRAVENOUS | Status: AC
  Administered 2013-01-25: 1000 mg via INTRAVENOUS
  Filled 2013-01-25: qty 200

## 2013-01-25 MED ORDER — DEXAMETHASONE SODIUM PHOSPHATE 4 MG/ML IJ SOLN
10.0000 mg | Freq: Once | INTRAMUSCULAR | Status: AC
  Administered 2013-01-25: 10 mg via INTRAVENOUS
  Filled 2013-01-25: qty 3

## 2013-01-25 MED ORDER — HEPARIN SODIUM (PORCINE) 5000 UNIT/ML IJ SOLN
5000.0000 [IU] | Freq: Three times a day (TID) | INTRAMUSCULAR | Status: DC
  Administered 2013-01-25: 5000 [IU] via SUBCUTANEOUS
  Filled 2013-01-25 (×4): qty 1

## 2013-01-25 MED ORDER — IBUPROFEN 800 MG PO TABS
800.0000 mg | ORAL_TABLET | Freq: Four times a day (QID) | ORAL | Status: DC | PRN
  Administered 2013-01-25: 800 mg via ORAL
  Filled 2013-01-25: qty 1

## 2013-01-25 NOTE — Progress Notes (Signed)
Pharmacy Consults: Acyclovir   32 yoM with h/o HSV meningitis in 2010 presented 12/26 with c/o HA, N/V. CSF cell count with many WBC, normal glucose, elevated protein. Likely recurrent HSV meningitis but covering for bacterial meningitis also.   12/27 >> acyclovir >> 12/27 >> ceftriaxone >> 12/27 >> vanc >>   Tmax: afeb  WBCs: 12K Renal: Scr 0.9, CG/N > 100  12/27 blood >> ordered 12/27 CSF >> ordered 12/28 HSV PCR >> ordered  Plan: Patient's ideal body weight is 82 kg so will adjust acyclovir to 820 mg q8h (10mg /kg q8h). Continue other abx as ordered.  Geoffry Paradise, PharmD, BCPS Pager: [phone] 11:50 AM Pharmacy #: [phone]

## 2013-01-25 NOTE — ED Notes (Signed)
MD at bedside. 

## 2013-01-25 NOTE — ED Provider Notes (Signed)
CSN: [id number]     Arrival date & time 01/24/13  1806 History   First MD Initiated Contact with Patient 01/25/13 0154     Chief Complaint  Patient presents with  . Headache  . Emesis   (Consider location/radiation/quality/duration/timing/severity/associated sxs/prior Treatment) HPI  2:03 AM  HX per PT - HA onset yesterday morning, located top of his head, pressure like and throbbing, mod to severe, not radiating, some neck pain no stiffness, has chills, no recorded fever, no rash, has h/o bacterial meningitis,  feels the same, HA constant not relieved by rest or NSAIDs at home. No diff with agit or speech. No Sz, no weakness or numbness, no known sick contacts Past Medical History  Diagnosis Date  . Bacterial meningitis    History reviewed. No pertinent past surgical history. History reviewed. No pertinent family history. History  Substance Use Topics  . Smoking status: Current Every Day Smoker -- 1.50 packs/day    Types: Cigarettes  . Smokeless tobacco: Never Used  . Alcohol Use: Yes     Comment: occ.    Review of Systems  Constitutional: Positive for chills.  Eyes: Negative for pain.  Respiratory: Negative for shortness of breath.   Cardiovascular: Negative for chest pain.  Gastrointestinal: Negative for abdominal pain.  Genitourinary: Negative for dysuria.  Musculoskeletal: Negative for back pain, neck pain and neck stiffness.  Skin: Negative for rash.  Neurological: Positive for headaches.  All other systems reviewed and are negative.    Allergies  Review of patient's allergies indicates no known allergies.  Home Medications   Current Outpatient Rx  Name  Route  Sig  Dispense  Refill  . ibuprofen (ADVIL,MOTRIN) 200 MG tablet   Oral   Take 800 mg by mouth every 6 (six) hours as needed for headache.          BP 160/102  Pulse 77  Temp(Src) 98.8 F (37.1 C) (Oral)  Resp 16  Ht 6\' 2"  (1.88 m)  Wt 265 lb (120.203 kg)  BMI 34.01 kg/m2  SpO2  98% Physical Exam  Constitutional: He is oriented to person, place, and time. He appears well-developed and well-nourished.  HENT:  Head: Normocephalic and atraumatic.  Eyes: Conjunctivae and EOM are normal. Pupils are equal, round, and reactive to light.  Neck: Full passive range of motion without pain. Neck supple. No thyromegaly present.  No nuchal rigidity  Cardiovascular: Normal rate, regular rhythm, S1 normal, S2 normal and intact distal pulses.   Pulmonary/Chest: Effort normal and breath sounds normal.  Abdominal: Soft. Bowel sounds are normal. There is no tenderness. There is no CVA tenderness.  Musculoskeletal: Normal range of motion.  Neurological: He is alert and oriented to person, place, and time. He has normal strength and normal reflexes. No cranial nerve deficit or sensory deficit. He displays a negative Romberg sign. GCS eye subscore is 4. GCS verbal subscore is 5. GCS motor subscore is 6.  Normal Gait  Skin: Skin is warm and dry. No rash noted. No cyanosis. Nails show no clubbing.  Psychiatric: He has a normal mood and affect. His speech is normal and behavior is normal.    ED Course  LUMBAR PUNCTURE Date/Time: 01/25/2013 3:48 AM Performed by: Sunnie Nielsen Authorized by: Sunnie Nielsen Consent: Verbal consent obtained. Risks and benefits: risks, benefits and alternatives were discussed Consent given by: patient Patient understanding: patient states understanding of the procedure being performed Patient consent: the patient's understanding of the procedure matches consent given Procedure consent: procedure consent  matches procedure scheduled Required items: required blood products, implants, devices, and special equipment available Patient identity confirmed: verbally with patient Time out: Immediately prior to procedure a "time out" was called to verify the correct patient, procedure, equipment, support staff and site/side marked as required. Indications: evaluation  for infection Anesthesia: local infiltration Local anesthetic: lidocaine 1% without epinephrine Anesthetic total: 2 ml Preparation: Patient was prepped and draped in the usual sterile fashion. Lumbar space: L4-L5 interspace Patient's position: sitting Needle gauge: 20 Needle type: spinal needle - Quincke tip Needle length: 2.5 in Number of attempts: 1 Fluid appearance: clear Tubes of fluid: 4 Total volume: 6 ml Post-procedure: site cleaned and adhesive bandage applied Patient tolerance: Patient tolerated the procedure well with no immediate complications.   (including critical care time) Labs Review Labs Reviewed  CBC - Abnormal; Notable for the following:    WBC 11.1 (*)    Hemoglobin 17.2 (*)    All other components within normal limits  BASIC METABOLIC PANEL - Abnormal; Notable for the following:    Sodium 134 (*)    Glucose, Bld 119 (*)    All other components within normal limits  CBC WITH DIFFERENTIAL - Abnormal; Notable for the following:    WBC 12.0 (*)    Neutrophils Relative % 86 (*)    Neutro Abs 10.3 (*)    Lymphocytes Relative 10 (*)    All other components within normal limits  COMPREHENSIVE METABOLIC PANEL - Abnormal; Notable for the following:    Sodium 131 (*)    Glucose, Bld 144 (*)    ALT 56 (*)    All other components within normal limits  CSF CELL COUNT WITH DIFFERENTIAL - Abnormal; Notable for the following:    RBC Count, CSF 1 (*)    WBC, CSF 253 (*)    Lymphs, CSF 86 (*)    Monocyte-Macrophage-Spinal Fluid 11 (*)    All other components within normal limits  CSF CELL COUNT WITH DIFFERENTIAL - Abnormal; Notable for the following:    WBC, CSF 310 (*)    Lymphs, CSF 90 (*)    Monocyte-Macrophage-Spinal Fluid 8 (*)    All other components within normal limits  PROTEIN, CSF - Abnormal; Notable for the following:    Total  Protein, CSF 136 (*)    All other components within normal limits  POCT I-STAT, CHEM 8 - Abnormal; Notable for the following:     Glucose, Bld 142 (*)    Calcium, Ion 1.08 (*)    Hemoglobin 17.7 (*)    All other components within normal limits  CULTURE, BLOOD (ROUTINE X 2)  CULTURE, BLOOD (ROUTINE X 2)  CSF CULTURE  GRAM STAIN  GLUCOSE, CSF  HERPES SIMPLEX VIRUS(HSV) DNA BY PCR  PATHOLOGIST SMEAR REVIEW   Imaging Review Ct Head Wo Contrast  01/25/2013   CLINICAL DATA:  Headache, nausea and vomiting.  EXAM: CT HEAD WITHOUT CONTRAST  TECHNIQUE: Contiguous axial images were obtained from the base of the skull through the vertex without intravenous contrast.  COMPARISON:  CT of the head performed 06/30/2010  FINDINGS: There is no evidence of acute infarction, mass lesion, or intra- or extra-axial hemorrhage on CT.  The posterior fossa, including the cerebellum, brainstem and fourth ventricle, is within normal limits. The third and lateral ventricles, and basal ganglia are unremarkable in appearance. The cerebral hemispheres are symmetric in appearance, with normal gray-white differentiation. No mass effect or midline shift is seen.  There is no evidence of fracture;  visualized osseous structures are unremarkable in appearance. The orbits are within normal limits. The paranasal sinuses and mastoid air cells are well-aerated. No significant soft tissue abnormalities are seen.  IMPRESSION: Unremarkable noncontrast CT of the head.   Electronically Signed   By: Roanna Raider M.D.   On: 01/25/2013 02:35   CT head, IV ABx, IVFs, IV HA cocktail  LP performed and MED consulted for admit MDM  DX: meningitis  Work up as above HA improving with medications Concerning LP results, has h/o meningitis MED admit   Sunnie Nielsen, MD 01/26/13 2302

## 2013-01-25 NOTE — Progress Notes (Signed)
Called to get report on patient at 6:16 AM, ED RN was "in room with patient", said she would call back shortly.

## 2013-01-25 NOTE — ED Notes (Signed)
PT TRANSPORTED TO CT 

## 2013-01-25 NOTE — H&P (Addendum)
[facility] History and Physical  Randall Colden AVW:[id number] DOB: 02-Jun-1980 DOA: 01/24/2013  Referring physician: EDP PCP: No primary provider on file.   Chief Complaint: Headache   HPI: Lee Anderson is a 32 y.o. male who presents to the ED with headache, meningismus.  Patient states headache pressure like sensation and throbbing, severe.  Has chills.  He states he has a history of multiple episodes of "bacterial" meningitis in the past (at least 2 times previously) and this feels the same.  Headache not relieved by NSAIDS.  Review of his records indicates that while his spinal tap was initially worrisome for bacterial meningitis in 2010, his meningitis turned out to be due to HSV instead.  Review of Systems: Systems reviewed.  As above, otherwise negative  Past Medical History  Diagnosis Date  . Bacterial meningitis    History reviewed. No pertinent past surgical history. Social History:  reports that he has been smoking Cigarettes.  He has been smoking about 1.50 packs per day. He has never used smokeless tobacco. He reports that he drinks alcohol. He reports that he does not use illicit drugs.  No Known Allergies  History reviewed. No pertinent family history.   Prior to Admission medications   Medication Sig Start Date End Date Taking? Authorizing Provider  ibuprofen (ADVIL,MOTRIN) 200 MG tablet Take 800 mg by mouth every 6 (six) hours as needed for headache.   Yes Historical Provider, MD   Physical Exam: Filed Vitals:   01/25/13 0548  BP: 152/87  Pulse: 82  Temp: 98.5 F (36.9 C)  Resp: 16    BP 152/87  Pulse 82  Temp(Src) 98.5 F (36.9 C) (Oral)  Resp 16  Ht 6\' 2"  (1.88 m)  Wt 120.203 kg (265 lb)  BMI 34.01 kg/m2  SpO2 93%  General Appearance:    Alert, oriented, no distress, appears stated age  Head:    Normocephalic, atraumatic  Eyes:    PERRL, EOMI, sclera non-icteric        Nose:   Nares without drainage or epistaxis. Mucosa, turbinates normal   Throat:   Moist mucous membranes. Oropharynx without erythema or exudate.  Neck:   meningismus. No carotid bruits.  No thyromegaly.  No lymphadenopathy.   Back:     No CVA tenderness, no spinal tenderness  Lungs:     Clear to auscultation bilaterally, without wheezes, rhonchi or rales  Chest wall:    No tenderness to palpitation  Heart:    Regular rate and rhythm without murmurs, gallops, rubs  Abdomen:     Soft, non-tender, nondistended, normal bowel sounds, no organomegaly  Genitalia:    deferred  Rectal:    deferred  Extremities:   No clubbing, cyanosis or edema.  Pulses:   2+ and symmetric all extremities  Skin:   Skin color, texture, turgor normal, no rashes or lesions  Lymph nodes:   Cervical, supraclavicular, and axillary nodes normal  Neurologic:   CNII-XII intact. Normal strength, sensation and reflexes      throughout    Labs on Admission:  Basic Metabolic Panel:  Recent Labs Lab 01/24/13 2010 01/25/13 0240 01/25/13 0247  NA 134* 131* 137  K 4.1 3.9 4.1  CL 100 97 103  CO2 21 20  --   GLUCOSE 119* 144* 142*  BUN 9 10 9   CREATININE 0.82 0.81 0.90  CALCIUM 9.7 9.6  --    Liver Function Tests:  Recent Labs Lab 01/25/13 0240  AST 27  ALT 56*  ALKPHOS 53  BILITOT 0.6  PROT 7.4  ALBUMIN 4.0   No results found for this basename: LIPASE, AMYLASE,  in the last 168 hours No results found for this basename: AMMONIA,  in the last 168 hours CBC:  Recent Labs Lab 01/24/13 2010 01/25/13 0240 01/25/13 0247  WBC 11.1* 12.0*  --   NEUTROABS  --  10.3*  --   HGB 17.2* 16.8 17.7*  HCT 48.1 47.5 52.0  MCV 92.3 92.6  --   PLT 221 211  --    Cardiac Enzymes: No results found for this basename: CKTOTAL, CKMB, CKMBINDEX, TROPONINI,  in the last 168 hours  BNP (last 3 results) No results found for this basename: PROBNP,  in the last 8760 hours CBG: No results found for this basename: GLUCAP,  in the last 168 hours  Radiological Exams on Admission: Ct Head Wo  Contrast  01/25/2013   CLINICAL DATA:  Headache, nausea and vomiting.  EXAM: CT HEAD WITHOUT CONTRAST  TECHNIQUE: Contiguous axial images were obtained from the base of the skull through the vertex without intravenous contrast.  COMPARISON:  CT of the head performed 06/30/2010  FINDINGS: There is no evidence of acute infarction, mass lesion, or intra- or extra-axial hemorrhage on CT.  The posterior fossa, including the cerebellum, brainstem and fourth ventricle, is within normal limits. The third and lateral ventricles, and basal ganglia are unremarkable in appearance. The cerebral hemispheres are symmetric in appearance, with normal gray-white differentiation. No mass effect or midline shift is seen.  There is no evidence of fracture; visualized osseous structures are unremarkable in appearance. The orbits are within normal limits. The paranasal sinuses and mastoid air cells are well-aerated. No significant soft tissue abnormalities are seen.  IMPRESSION: Unremarkable noncontrast CT of the head.   Electronically Signed   By: Roanna Raider M.D.   On: 01/25/2013 02:35    EKG: Independently reviewed.  Assessment/Plan Principal Problem:   Meningitis Active Problems:   Mollaret's meningitis   1. 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  Patient put on IV acyclovir for treatment.    Code Status: Full Code  Family Communication: No family in room Disposition Plan: Admit to inpatient   Time spent: 70 min  Ellyssa Zagal M. [facility] Pager [phone]  If 7AM-7PM, please contact the day team taking care of the patient [email or web address] Password [email or web address] 01/25/2013, 5:53 AM

## 2013-01-25 NOTE — Discharge Summary (Signed)
Physician Discharge Summary  Lee Anderson ZOX:[id number] DOB: 10-02-1980 DOA: 01/24/2013  PCP: No primary provider on file.  Admit date: 01/24/2013 Discharge date: 01/25/2013  Time spent: 45 minutes  Recommendations for Outpatient Follow-up:  -Will be discharged home today. -Advised to follow up with PCP in 2 weeks.   Discharge Diagnoses:  Principal Problem:   Mollaret's meningitis Active Problems:   Genital herpes simplex   Headache(784.0)   Discharge Condition: Stable and improved  Filed Weights   01/24/13 1922 01/25/13 0705  Weight: 120.203 kg (265 lb) 125 kg (275 lb 9.2 oz)    History of present illness:  Lee Anderson is a 32 y.o. male who presents to the ED with headache, meningismus. Patient states headache pressure like sensation and throbbing, severe. Has chills. He states he has a history of multiple episodes of "bacterial" meningitis in the past (at least 2 times previously) and this feels the same. Headache not relieved by NSAIDS.  Review of his records indicates that while his spinal tap was initially worrisome for bacterial meningitis in 2010, his meningitis turned out to be due to HSV instead. Hospitalist admission was requested.   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 There are no studies of antiviral therapy for acute episodes but it is likely that high dose oral therapy may help resolve his illness more quickly. There is also some evidence that valacyclovir 500 mg twice daily taken as chronic suppressive therapy reduces the risk of recurrences. -he cannot afford chronic suppresive therapy. -Will treat as per ID recs with valacyclovir 1000mg  TID for 7 days and provide him with a second prescription for if his symptoms return. -He feels he can manage his HA at home with PO meds.   Procedures:  None    Consultations:  ID, Dr.Campbell  Discharge Instructions  Discharge Orders   Future Orders Complete By Expires   Discontinue IV  As directed    Increase activity slowly  As directed        Medication List         ibuprofen 200 MG tablet  Commonly known as:  ADVIL,MOTRIN  Take 800 mg by mouth every 6 (six) hours as needed for headache.     oxycodone 5 MG capsule  Commonly known as:  OXY-IR  Take 1 capsule (5 mg total) by mouth every 4 (four) hours as needed.     valACYclovir 1000 MG tablet  Commonly known as:  VALTREX  Take 1 tablet (1,000 mg total) by mouth 3 (three) times daily. For 7 days when headache recurrs       No Known Allergies     Follow-up Information   Schedule an appointment as soon as possible for a visit in 2 weeks to follow up. (with your regular provider)        The results of significant diagnostics from this hospitalization (including imaging, microbiology, ancillary and laboratory) are listed below for reference.    Significant Diagnostic Studies: Ct Head Wo Contrast  01/25/2013   CLINICAL DATA:  Headache, nausea and vomiting.  EXAM: CT HEAD WITHOUT CONTRAST  TECHNIQUE: Contiguous axial images were obtained from the base of the skull through the vertex without intravenous contrast.  COMPARISON:  CT of the head performed 06/30/2010  FINDINGS: There is no evidence of acute infarction, mass lesion, or intra- or extra-axial hemorrhage on CT.  The posterior fossa, including the cerebellum,  brainstem and fourth ventricle, is within normal limits. The third and lateral ventricles, and basal ganglia are unremarkable in appearance. The cerebral hemispheres are symmetric in appearance, with normal gray-white differentiation. No mass effect or midline shift is seen.  There is no evidence of fracture; visualized osseous structures are unremarkable in appearance. The orbits are within normal limits. The paranasal sinuses and mastoid air cells are well-aerated. No  significant soft tissue abnormalities are seen.  IMPRESSION: Unremarkable noncontrast CT of the head.   Electronically Signed   By: Roanna Raider M.D.   On: 01/25/2013 02:35    Microbiology: Recent Results (from the past 240 hour(s))  CSF CULTURE     Status: None   Collection Time    01/25/13  3:47 AM      Result Value Range Status   Specimen Description CSF   Final   Special Requests NONE   Final   Gram Stain     Final   Value: WBC PRESENT, PREDOMINANTLY MONONUCLEAR     NO ORGANISMS SEEN     Gram Stain Report Called to,Read Back By and Verified With: Gram Stain Report Called to,Read Back By and Verified With:  Talbert Nan @0515  [id number] KARCZEWSKI S CYTOSPIN Performed by [facility]     Performed at [facility]   Culture PENDING   Incomplete   Report Status PENDING   Incomplete  GRAM STAIN     Status: None   Collection Time    01/25/13  3:47 AM      Result Value Range Status   Specimen Description CSF   Final   Special Requests NONE   Final   Gram Stain     Final   Value: WBC PRESENT, PREDOMINANTLY MONONUCLEAR     NO ORGANISMS SEEN     CYTOSPIN PREP     Gram Stain Report Called to,Read Back By and Verified With: OXENDINE,J/ED @0515  ON 01/25/13 BY KARCZEWSKI,S.   Report Status 01/25/2013 FINAL   Final     Labs: Basic Metabolic Panel:  Recent Labs Lab 01/24/13 2010 01/25/13 0240 01/25/13 0247  NA 134* 131* 137  K 4.1 3.9 4.1  CL 100 97 103  CO2 21 20  --   GLUCOSE 119* 144* 142*  BUN 9 10 9   CREATININE 0.82 0.81 0.90  CALCIUM 9.7 9.6  --    Liver Function Tests:  Recent Labs Lab 01/25/13 0240  AST 27  ALT 56*  ALKPHOS 53  BILITOT 0.6  PROT 7.4  ALBUMIN 4.0   No results found for this basename: LIPASE, AMYLASE,  in the last 168 hours No results found for this basename: AMMONIA,  in the last 168 hours CBC:  Recent Labs Lab 01/24/13 2010 01/25/13 0240 01/25/13 0247  WBC 11.1* 12.0*  --   NEUTROABS  --  10.3*  --   HGB 17.2* 16.8  17.7*  HCT 48.1 47.5 52.0  MCV 92.3 92.6  --   PLT 221 211  --    Cardiac Enzymes: No results found for this basename: CKTOTAL, CKMB, CKMBINDEX, TROPONINI,  in the last 168 hours BNP: BNP (last 3 results) No results found for this basename: PROBNP,  in the last 8760 hours CBG: No results found for this basename: GLUCAP,  in the last 168 hours     Signed:  Chaya Jan  [facility] Pager: [phone] 01/25/2013, 4:01 PM

## 2013-01-25 NOTE — Progress Notes (Signed)
Patient ID: Lee Anderson, male   DOB: 1980/08/09, 32 y.o.   MRN: [id number]         [facility]    Date of Admission:  01/24/2013   Day 1 vancomycin        Day 1 ceftriaxone        Day 1 acyclovir        Day 1 dexamethasone       Reason for Consult: Recurrent meningitis    Referring Physician: Dr. Ted Mcalpine  Principal Problem:   Mollaret's meningitis Active Problems:   Genital herpes simplex   . acyclovir  1,000 mg Intravenous Q8H  . cefTRIAXone (ROCEPHIN)  IV  2 g Intravenous Q12H  . dexamethasone  5.2 mg Intravenous Once  . heparin  5,000 Units Subcutaneous Q8H  . [START ON 01/26/2013] influenza vac split quadrivalent PF  0.5 mL Intramuscular Tomorrow-1000  . [START ON 01/26/2013] pneumococcal 23 valent vaccine  0.5 mL Intramuscular Tomorrow-1000  . vancomycin  1,250 mg Intravenous Q12H    Recommendations: 1. Start valacyclovir 1 g 3 times a day for 7 days  2. Pain management 3. Discontinue IV vancomycin, ceftriaxone acyclovir and dexamethasone 4. Discontinue airborne precautions  Assessment: He has recurrent herpes simplex type II meningitis (often referred to as Mollarett's meningitis). The syndrome is known to cause recurrent, benign lymphocytic meningitis. There are no studies of antiviral therapy for acute episodes but it is likely that high dose oral therapy may help resolve his illness more quickly. There is also some evidence that valacyclovir 500 mg twice daily taken as chronic suppressive therapy reduces the risk of recurrences. If he chooses not to take chronic suppressive therapy or cannot afford it I would consider giving him a seven-day supply of therapy to have on hand to start as soon as his next episode begins. He does not need any isolation and can be discharged home once his headache is managable with oral therapy. He is hoping he will be able to go home later this afternoon.   HPI: Lee Anderson is a 32 y.o. male who  developed severe headache on Christmas Day leading to admission yesterday. Spinal tap reveals lymphocytic meningitis. He has had 3 previous episodes of lymphocytic meningitis in 2008, 2010 in 2011. Spinal fluid was PCR positive for herpes simplex type II in 2010. He has not had health insurance and has not been able to afford acyclovir or valacyclovir to take as chronic suppressive therapy.   Review of Systems: Constitutional: positive for fevers, negative for anorexia, chills, sweats and weight loss Eyes: positive for mild photophobia Ears, nose, mouth, throat, and face: negative Respiratory: negative Cardiovascular: negative Gastrointestinal: negative Genitourinary:negative  Past Medical History  Diagnosis Date  . Bacterial meningitis     History  Substance Use Topics  . Smoking status: Current Every Day Smoker -- 1.50 packs/day    Types: Cigarettes  . Smokeless tobacco: Never Used  . Alcohol Use: Yes     Comment: occ.    History reviewed. No pertinent family history. No Known Allergies  OBJECTIVE: Blood pressure 160/90, pulse 82, temperature 99.1 F (37.3 C), temperature source Oral, resp. rate 22, height 6\' 2"  (1.88 m), weight 125 kg (275 lb 9.2 oz), SpO2 97.00%. General: He is alert and in no distress Skin: Face flushed but no rash Lungs: Clear Cor: Regular S1 and S2 no murmurs Abdomen: Soft and nontender Neuro: Alert and fully oriented with normal speech and conversation  Lab Results Lab Results  Component Value Date   WBC 12.0* 01/25/2013   HGB 17.7* 01/25/2013   HCT 52.0 01/25/2013   MCV 92.6 01/25/2013   PLT 211 01/25/2013    Lab Results  Component Value Date   CREATININE 0.90 01/25/2013   BUN 9 01/25/2013   NA 137 01/25/2013   K 4.1 01/25/2013   CL 103 01/25/2013   CO2 20 01/25/2013    Lab Results  Component Value Date   ALT 56* 01/25/2013   AST 27 01/25/2013   ALKPHOS 53 01/25/2013   BILITOT 0.6 01/25/2013     Microbiology: Recent Results  (from the past 240 hour(s))  GRAM STAIN     Status: None   Collection Time    01/25/13  3:47 AM      Result Value Range Status   Specimen Description CSF   Final   Special Requests NONE   Final   Gram Stain     Final   Value: WBC PRESENT, PREDOMINANTLY MONONUCLEAR     NO ORGANISMS SEEN     CYTOSPIN PREP     Gram Stain Report Called to,Read Back By and Verified With: OXENDINE,J/ED @0515  ON 01/25/13 BY KARCZEWSKI,S.   Report Status 01/25/2013 FINAL   Final    Cliffton Asters, MD [facility] [phone] pager   [phone] cell 01/25/2013, 11:42 AM

## 2013-01-25 NOTE — Progress Notes (Signed)
ANTIBIOTIC CONSULT NOTE - INITIAL  Pharmacy Consult for acyclovir, vancomycin Indication: Meningitis  No Known Allergies  Patient Measurements: Height: 6\' 2"  (188 cm) Weight: 265 lb (120.203 kg) IBW/kg (Calculated) : 82.2 Adjusted Body Weight:   Vital Signs: Temp: 98.5 F (36.9 C) (12/27 0548) Temp src: Oral (12/27 0548) BP: 152/87 mmHg (12/27 0548) Pulse Rate: 82 (12/27 0548) Intake/Output from previous day:   Intake/Output from this shift:    Labs:  Recent Labs  01/24/13 2010 01/25/13 0240 01/25/13 0247  WBC 11.1* 12.0*  --   HGB 17.2* 16.8 17.7*  PLT 221 211  --   CREATININE 0.82 0.81 0.90   Estimated Creatinine Clearance: 162.3 ml/min (by C-G formula based on Cr of 0.9). No results found for this basename: VANCOTROUGH, VANCOPEAK, VANCORANDOM, GENTTROUGH, GENTPEAK, GENTRANDOM, TOBRATROUGH, TOBRAPEAK, TOBRARND, AMIKACINPEAK, AMIKACINTROU, AMIKACIN,  in the last 72 hours   Microbiology: Recent Results (from the past 720 hour(s))  GRAM STAIN     Status: None   Collection Time    01/25/13  3:47 AM      Result Value Range Status   Specimen Description CSF   Final   Special Requests NONE   Final   Gram Stain     Final   Value: WBC PRESENT, PREDOMINANTLY MONONUCLEAR     NO ORGANISMS SEEN     CYTOSPIN PREP     Gram Stain Report Called to,Read Back By and Verified With: OXENDINE,J/ED @0515  ON 01/25/13 BY KARCZEWSKI,S.   Report Status 01/25/2013 FINAL   Final    Medical History: Past Medical History  Diagnosis Date  . Bacterial meningitis     Medications:  Anti-infectives   Start     Dose/Rate Route Frequency Ordered Stop   01/25/13 2200  cefTRIAXone (ROCEPHIN) 2 g in dextrose 5 % 50 mL IVPB  Status:  Discontinued     2 g 100 mL/hr over 30 Minutes Intravenous Every 12 hours 01/25/13 0549 01/25/13 0550   01/25/13 1800  cefTRIAXone (ROCEPHIN) 2 g in dextrose 5 % 50 mL IVPB     2 g 100 mL/hr over 30 Minutes Intravenous Every 12 hours 01/25/13 0551     01/25/13 1400  acyclovir (ZOVIRAX) 1,000 mg in dextrose 5 % 150 mL IVPB     1,000 mg 170 mL/hr over 60 Minutes Intravenous 3 times per day 01/25/13 0645     01/25/13 1200  vancomycin (VANCOCIN) 1,250 mg in sodium chloride 0.9 % 250 mL IVPB     1,250 mg 166.7 mL/hr over 90 Minutes Intravenous Every 12 hours 01/25/13 0645     01/25/13 0415  acyclovir (ZOVIRAX) 1,000 mg in dextrose 5 % 150 mL IVPB     1,000 mg 170 mL/hr over 60 Minutes Intravenous  Once 01/25/13 0400     01/25/13 0215  cefTRIAXone (ROCEPHIN) 2 g in dextrose 5 % 50 mL IVPB     2 g 100 mL/hr over 30 Minutes Intravenous  Once 01/25/13 0207 01/25/13 0630   01/25/13 0215  vancomycin (VANCOCIN) IVPB 1000 mg/200 mL premix     1,000 mg 200 mL/hr over 60 Minutes Intravenous  Once 01/25/13 0207       Assessment: Obese patient with Meningitis, 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  First dose of antibiotics already sent to ED, just being given now.  Patient with great renal function.  Patient >100kg but only 1gm given, will shorten dosing interval with maintenance dose to make dose more like 2gm load.  Due to prior  history and obesity, will dose 10-15mg /kg (using IBW) which is also included in dosing reference.  Goal of Therapy:  Vancomycin trough level 15-20 mcg/ml Acyclovir per dosing reference 10-15mg /kg  Plan:  Measure antibiotic drug levels at steady state Follow up culture results Vancomycin 1250mg  iv q12hr, next dose at 1200 Acyclovir 1gm iv q8hr  Darlina Guys, Jacquenette Shone Crowford 01/25/2013,6:46 AM

## 2013-01-27 LAB — HERPES SIMPLEX VIRUS(HSV) DNA BY PCR: HSV 1 DNA: NOT DETECTED

## 2013-01-27 LAB — PATHOLOGIST SMEAR REVIEW

## 2013-01-28 LAB — CSF CULTURE W GRAM STAIN

## 2013-01-31 LAB — CULTURE, BLOOD (ROUTINE X 2)
Culture: NO GROWTH
Culture: NO GROWTH

## 2014-02-10 ENCOUNTER — Emergency Department (HOSPITAL_BASED_OUTPATIENT_CLINIC_OR_DEPARTMENT_OTHER)
Admission: EM | Admit: 2014-02-10 | Discharge: 2014-02-10 | Disposition: A | Discharge status: Home / Self Care | Payer: Self-pay | Attending: Emergency Medicine | Admitting: Emergency Medicine

## 2014-02-10 ENCOUNTER — Encounter (HOSPITAL_BASED_OUTPATIENT_CLINIC_OR_DEPARTMENT_OTHER): Payer: Self-pay

## 2014-02-10 DIAGNOSIS — J069 Acute upper respiratory infection, unspecified: Secondary | ICD-10-CM | POA: Insufficient documentation

## 2014-02-10 DIAGNOSIS — Z720 Tobacco use: Secondary | ICD-10-CM | POA: Insufficient documentation

## 2014-02-10 DIAGNOSIS — M62838 Other muscle spasm: Secondary | ICD-10-CM | POA: Insufficient documentation

## 2014-02-10 DIAGNOSIS — Z8669 Personal history of other diseases of the nervous system and sense organs: Secondary | ICD-10-CM | POA: Insufficient documentation

## 2014-02-10 DIAGNOSIS — Z79899 Other long term (current) drug therapy: Secondary | ICD-10-CM | POA: Insufficient documentation

## 2014-02-10 MED ORDER — HYDROCODONE-ACETAMINOPHEN 5-325 MG PO TABS: 1.0000 | ORAL_TABLET | ORAL | Status: DC | PRN

## 2014-02-10 MED ORDER — NAPROXEN 500 MG PO TABS: 500.0000 mg | ORAL_TABLET | Freq: Two times a day (BID) | ORAL | Status: DC

## 2014-02-10 MED ORDER — CYCLOBENZAPRINE HCL 10 MG PO TABS: 10.0000 mg | ORAL_TABLET | Freq: Two times a day (BID) | ORAL | Status: DC | PRN

## 2014-02-10 NOTE — ED Provider Notes (Signed)
CSN: [id number]     Arrival date & time 02/10/14  1001 History   First MD Initiated Contact with Patient 02/10/14 1018     Chief Complaint  Patient presents with  . Neck Pain     (Consider location/radiation/quality/duration/timing/severity/associated sxs/prior Treatment) HPI Comments: Secondly patient came because approximately 4 days prior to arrival he slipped in a water puddle and fell on his left side. Proximally 12 hours later he started to develop left trapezius pain that has gradually worsened over the last 3 days. It is worse when he moves his arm in certain positions. He denies any shortness of breath. There is no numbness, tingling or weakness in the left arm.  Patient is a 34 y.o. male presenting with URI. The history is provided by the patient.  URI Presenting symptoms: congestion, cough and fatigue   Presenting symptoms comment:  Body aches Severity:  Moderate Onset quality:  Gradual Duration:  3 days Timing:  Constant Progression:  Worsening Chronicity:  New Relieved by:  None tried Worsened by:  Nothing tried Associated symptoms: myalgias   Associated symptoms: no wheezing   Risk factors: sick contacts   Risk factors: no diabetes mellitus     Past Medical History  Diagnosis Date  . Bacterial meningitis    History reviewed. No pertinent past surgical history. No family history on file. History  Substance Use Topics  . Smoking status: Current Every Day Smoker -- 1.50 packs/day    Types: Cigarettes  . Smokeless tobacco: Never Used  . Alcohol Use: Yes     Comment: occ.    Review of Systems  Constitutional: Positive for chills and fatigue.  HENT: Positive for congestion.   Respiratory: Positive for cough. Negative for wheezing.   Musculoskeletal: Positive for myalgias.  All other systems reviewed and are negative.     Allergies  Review of patient's allergies indicates no known allergies.  Home Medications   Prior to Admission medications     Medication Sig Start Date End Date Taking? Authorizing Provider  ibuprofen (ADVIL,MOTRIN) 200 MG tablet Take 800 mg by mouth every 6 (six) hours as needed for headache.    Historical Provider, MD  oxycodone (OXY-IR) 5 MG capsule Take 1 capsule (5 mg total) by mouth every 4 (four) hours as needed. 01/25/13   Henderson Cloud, MD  valACYclovir (VALTREX) 1000 MG tablet Take 1 tablet (1,000 mg total) by mouth 3 (three) times daily. For 7 days when headache recurrs 01/25/13   Henderson Cloud, MD   BP 157/113 mmHg  Pulse 105  Temp(Src) 98.4 F (36.9 C) (Oral)  Resp 18  Ht  (1.88 m)  Wt 265 lb (120.203 kg)  BMI 34.01 kg/m2  SpO2 98% Physical Exam  Constitutional: He is oriented to person, place, and time. He appears well-developed and well-nourished. No distress.  HENT:  Head: Normocephalic and atraumatic.  Right Ear: Tympanic membrane and ear canal normal.  Left Ear: Tympanic membrane and ear canal normal.  Nose: Mucosal edema present.  Mouth/Throat: Oropharynx is clear and moist.  Eyes: Conjunctivae and EOM are normal. Pupils are equal, round, and reactive to light.  Neck: Normal range of motion. Neck supple. Muscular tenderness present. No spinous process tenderness present. Kernig's sign noted. No Brudzinski's sign noted.    Cardiovascular: Normal rate, regular rhythm and intact distal pulses.   No murmur heard. Pulmonary/Chest: Effort normal and breath sounds normal. No respiratory distress. He has no wheezes. He has no rales.  Abdominal:  Soft. He exhibits no distension. There is no tenderness. There is no rebound and no guarding.  Musculoskeletal: Normal range of motion. He exhibits no edema or tenderness.  Neurological: He is alert and oriented to person, place, and time.  Skin: Skin is warm and dry. No rash noted. No erythema.  Psychiatric: He has a normal mood and affect. His behavior is normal.  Nursing note and vitals reviewed.   ED Course   Procedures (including critical care time) Labs Review Labs Reviewed - No data to display  Imaging Review No results found.   EKG Interpretation None      MDM   Final diagnoses:  Trapezius muscle spasm  URI (upper respiratory infection)    Patient with evidence of new left trapezius spasm. Patient has no centralized neck pain or signs concerning for meningitis. Pain started approximately 12 hours after a fall so he must likely pulled a muscle.  Will treat with muscle relaxers and pain medication.  Secondly pt with symptoms consistent with viral URI.  Well appearing here.  No signs of breathing difficulty  No signs of pharyngitis, otitis or abnormal abdominal findings.   pt to return with any further problems.     Gwyneth SproutWhitney Sybella Harnish, MD 02/10/14 1521

## 2014-02-10 NOTE — ED Notes (Signed)
States he fell Saturday night and denies specific injury.  Developed neck pain on Sunday afternoon, associated with generalized body aches, cough, and chills.

## 2015-05-05 ENCOUNTER — Emergency Department (HOSPITAL_BASED_OUTPATIENT_CLINIC_OR_DEPARTMENT_OTHER)
Admission: EM | Admit: 2015-05-05 | Discharge: 2015-05-05 | Disposition: A | Discharge status: Home / Self Care | Payer: Self-pay | Attending: Emergency Medicine | Admitting: Emergency Medicine

## 2015-05-05 ENCOUNTER — Encounter (HOSPITAL_BASED_OUTPATIENT_CLINIC_OR_DEPARTMENT_OTHER): Payer: Self-pay | Admitting: *Deleted

## 2015-05-05 ENCOUNTER — Emergency Department (HOSPITAL_BASED_OUTPATIENT_CLINIC_OR_DEPARTMENT_OTHER): Payer: Self-pay

## 2015-05-05 DIAGNOSIS — J189 Pneumonia, unspecified organism: Secondary | ICD-10-CM | POA: Insufficient documentation

## 2015-05-05 DIAGNOSIS — F17210 Nicotine dependence, cigarettes, uncomplicated: Secondary | ICD-10-CM | POA: Insufficient documentation

## 2015-05-05 MED ORDER — BENZONATATE 100 MG PO CAPS: 100.0000 mg | ORAL_CAPSULE | Freq: Three times a day (TID) | ORAL | Status: DC

## 2015-05-05 MED ORDER — AEROCHAMBER PLUS FLO-VU MEDIUM MISC
1.0000 | Freq: Once | Status: AC
  Administered 2015-05-05: 1
  Filled 2015-05-05: qty 1

## 2015-05-05 MED ORDER — DEXAMETHASONE 6 MG PO TABS
10.0000 mg | ORAL_TABLET | Freq: Once | ORAL | Status: AC
  Administered 2015-05-05: 22:00:00 10 mg via ORAL
  Filled 2015-05-05: qty 1

## 2015-05-05 MED ORDER — ALBUTEROL SULFATE HFA 108 (90 BASE) MCG/ACT IN AERS: 1.0000 | INHALATION_SPRAY | Freq: Four times a day (QID) | RESPIRATORY_TRACT | Status: AC | PRN

## 2015-05-05 MED ORDER — ALBUTEROL SULFATE (2.5 MG/3ML) 0.083% IN NEBU
5.0000 mg | INHALATION_SOLUTION | Freq: Once | RESPIRATORY_TRACT | Status: AC
  Administered 2015-05-05: 5 mg via RESPIRATORY_TRACT
  Filled 2015-05-05: qty 6

## 2015-05-05 MED ORDER — LEVOFLOXACIN 750 MG PO TABS: 750.0000 mg | ORAL_TABLET | Freq: Every day | ORAL | Status: DC

## 2015-05-05 MED ORDER — ALBUTEROL SULFATE HFA 108 (90 BASE) MCG/ACT IN AERS
1.0000 | INHALATION_SPRAY | RESPIRATORY_TRACT | Status: DC | PRN
  Administered 2015-05-05: 2 via RESPIRATORY_TRACT
  Filled 2015-05-05: qty 6.7

## 2015-05-05 MED ORDER — LEVOFLOXACIN 750 MG PO TABS
750.0000 mg | ORAL_TABLET | Freq: Once | ORAL | Status: AC
  Administered 2015-05-05: 750 mg via ORAL
  Filled 2015-05-05: qty 1

## 2015-05-05 NOTE — Discharge Instructions (Signed)
You were seen and evaluated today for your cough. It appears that you have pneumonia. Take the antibiotic prescribed. Use the cough medicine as needed. Keep herself well hydrated. Follow up outpatient for reevaluation. If your symptoms continue to worsen or you are unable to drink fluids return to the emergency department.  Community-Acquired Pneumonia, Adult Pneumonia is an infection of the lungs. One type of pneumonia can happen while a person is in a hospital. A different type can happen when a person is not in a hospital (community-acquired pneumonia). It is easy for this kind to spread from person to person. It can spread to you if you breathe near an infected person who coughs or sneezes. Some symptoms include:  A dry cough.  A wet (productive) cough.  Fever.  Sweating.  Chest pain. HOME CARE  Take over-the-counter and prescription medicines only as told by your doctor.  Only take cough medicine if you are losing sleep.  If you were prescribed an antibiotic medicine, take it as told by your doctor. Do not stop taking the antibiotic even if you start to feel better.  Sleep with your head and neck raised (elevated). You can do this by putting a few pillows under your head, or you can sleep in a recliner.  Do not use tobacco products. These include cigarettes, chewing tobacco, and e-cigarettes. If you need help quitting, ask your doctor.  Drink enough water to keep your pee (urine) clear or pale yellow. A shot (vaccine) can help prevent pneumonia. Shots are often suggested for:  People older than 35 years of age.  People older than 35 years of age:  Who are having cancer treatment.  Who have long-term (chronic) lung disease.  Who have problems with their body's defense system (immune system). You may also prevent pneumonia if you take these actions:  Get the flu (influenza) shot every year.  Go to the dentist as often as told.  Wash your hands often. If soap and water  are not available, use hand sanitizer. GET HELP IF:  You have a fever.  You lose sleep because your cough medicine does not help. GET HELP RIGHT AWAY IF:  You are short of breath and it gets worse.  You have more chest pain.  Your sickness gets worse. This is very serious if:  You are an older adult.  Your body's defense system is weak.  You cough up blood.   This information is not intended to replace advice given to you by your health care provider. Make sure you discuss any questions you have with your health care provider.   Document Released: 07/05/2007 Document Revised: 10/07/2014 Document Reviewed: 05/13/2014 Elsevier Interactive Patient Education [employer]2016 [employer]

## 2015-05-05 NOTE — ED Notes (Signed)
Pt amb to triage with quick steady gait in nad. Pt reports 2 weeks of cough, non productive, with fevers up to 102 last week, low grade temps this week.

## 2015-05-05 NOTE — ED Provider Notes (Signed)
CSN: [id number]     Arrival date & time 05/05/15  1800 History  By signing my name below, I, Phillis Haggis, attest that this documentation has been prepared under the direction and in the presence of Leta Baptist, MD. Electronically Signed: Phillis Haggis, ED Scribe. 05/05/2015. 9:12 PM.   Chief Complaint  Patient presents with  . Cough   The history is provided by the patient. No language interpreter was used.  HPI Comments: Lee Anderson is a 35 y.o. male with a hx of bacterial meningitis who presents to the Emergency Department complaining of gradually worsening dry cough onset 2 weeks ago. Pt reports associated intermittent fever tmax 102 F, decreased appetite, and states that he has lost about 10 lbs in the past 2 weeks due to lack of appetite. Pt states that his son was sick with similar symptoms but did not last as long as the pt's symptoms. Pt has been taking NyQuil for sleep to no relief. Pt is a smoker, but states that he has decreased this over the past two weeks. He denies congestion, nausea, or vomiting.   Past Medical History  Diagnosis Date  . Bacterial meningitis    History reviewed. No pertinent past surgical history. History reviewed. No pertinent family history. Social History  Substance Use Topics  . Smoking status: Current Every Day Smoker -- 1.50 packs/day    Types: Cigarettes  . Smokeless tobacco: Never Used  . Alcohol Use: Yes     Comment: occ.    Review of Systems  Constitutional: Positive for fever.  Respiratory: Positive for cough.   All other systems reviewed and are negative.     Allergies  Review of patient's allergies indicates no known allergies.  Home Medications   Prior to Admission medications   Not on File   BP 118/74 mmHg  Pulse 75  Temp(Src) 98.9 F (37.2 C) (Oral)  Resp 20  Ht  (1.88 m)  Wt 250 lb (113.399 kg)  BMI 32.08 kg/m2  SpO2 95% Physical Exam  Constitutional: He is oriented to person, place, and time. He  appears well-developed and well-nourished. No distress.  HENT:  Head: Normocephalic and atraumatic.  Right Ear: External ear normal.  Left Ear: External ear normal.  Mouth/Throat: Oropharynx is clear and moist. No oropharyngeal exudate.  Eyes: EOM are normal. Pupils are equal, round, and reactive to light.  Neck: Normal range of motion. Neck supple.  Cardiovascular: Normal rate, regular rhythm, normal heart sounds and intact distal pulses.   No murmur heard. Pulmonary/Chest: Effort normal. No respiratory distress. He has wheezes. He has no rales.  Wheezes in right upper lobe  Abdominal: Soft. He exhibits no distension. There is no tenderness.  Musculoskeletal: He exhibits no edema.  Neurological: He is alert and oriented to person, place, and time.  Skin: Skin is warm and dry. No rash noted. He is not diaphoretic.  Vitals reviewed.   ED Course  Procedures (including critical care time) DIAGNOSTIC STUDIES: Oxygen Saturation is 95% on RA, normal by my interpretation.    COORDINATION OF CARE: 8:01 PM-Discussed treatment plan which includes breathing treatment and chest x-ray with pt at bedside and pt agreed to plan.    Labs Review Labs Reviewed - No data to display  Imaging Review Dg Chest 2 View  05/05/2015  CLINICAL DATA:  Cough and fevers for 1 week EXAM: CHEST  2 VIEW COMPARISON:  06/30/2010 FINDINGS: Cardiac shadow is within normal limits. The lungs are well aerated bilaterally but  demonstrate patchy infiltrates in the mid and lower lung fields bilaterally. No sizable effusion is seen. No acute bony abnormality is noted. IMPRESSION: Patchy changes bilaterally likely related to multifocal pneumonia. Electronically Signed   By: Alcide CleverMark  Lukens M.D.   On: 05/05/2015 21:05   I have personally reviewed and evaluated these images and lab results as part of my medical decision-making.   EKG Interpretation None      MDM  Patient was seen and evaluated in stable condition.   Examination improved with breathing treatment.  Xray with pneumonia.  Patient saturating well on RA, not tachycardic, non toxic appearing.  Patient given Decadron and provided albuterol inhaler with chamber as well as prescription for levaquin.  He was discharged home in stable condition but with strict return precautions and education and instruction on smoking cessation. Final diagnoses:  None    1. Pneumonia, community acquired   I personally performed the services described in this documentation, which was scribed in my presence. The recorded information has been reviewed and is accurate.   Leta BaptistEmily Roe Tali Coster, MD 05/07/15 2209

## 2018-05-06 ENCOUNTER — Encounter (HOSPITAL_BASED_OUTPATIENT_CLINIC_OR_DEPARTMENT_OTHER): Payer: Self-pay | Admitting: Emergency Medicine

## 2018-05-06 ENCOUNTER — Emergency Department (HOSPITAL_BASED_OUTPATIENT_CLINIC_OR_DEPARTMENT_OTHER)
Admission: EM | Admit: 2018-05-06 | Discharge: 2018-05-06 | Disposition: A | Discharge status: Home / Self Care | Payer: Self-pay | Attending: Emergency Medicine | Admitting: Emergency Medicine

## 2018-05-06 ENCOUNTER — Other Ambulatory Visit: Payer: Self-pay

## 2018-05-06 DIAGNOSIS — F17210 Nicotine dependence, cigarettes, uncomplicated: Secondary | ICD-10-CM | POA: Insufficient documentation

## 2018-05-06 DIAGNOSIS — G032 Benign recurrent meningitis [Mollaret]: Secondary | ICD-10-CM | POA: Insufficient documentation

## 2018-05-06 LAB — CBC
HCT: 47 % (ref 39.0–52.0)
Hemoglobin: 16.1 g/dL (ref 13.0–17.0)
MCH: 32.7 pg (ref 26.0–34.0)
MCHC: 34.3 g/dL (ref 30.0–36.0)
MCV: 95.5 fL (ref 80.0–100.0)
Platelets: 264 10*3/uL (ref 150–400)
RBC: 4.92 MIL/uL (ref 4.22–5.81)
RDW: 12.4 % (ref 11.5–15.5)
WBC: 11.4 10*3/uL — ABNORMAL HIGH (ref 4.0–10.5)
nRBC: 0 % (ref 0.0–0.2)

## 2018-05-06 LAB — BASIC METABOLIC PANEL
Anion gap: 6 (ref 5–15)
BUN: 12 mg/dL (ref 6–20)
CO2: 24 mmol/L (ref 22–32)
Calcium: 9 mg/dL (ref 8.9–10.3)
Chloride: 105 mmol/L (ref 98–111)
Creatinine, Ser: 1.04 mg/dL (ref 0.61–1.24)
GFR calc Af Amer: >60 mL/min (ref >60)
GFR calc non Af Amer: >60 mL/min (ref >60)
Glucose, Bld: 115 mg/dL — ABNORMAL HIGH (ref 70–99)
Potassium: 3.4 mmol/L — ABNORMAL LOW (ref 3.5–5.1)
Sodium: 135 mmol/L (ref 135–145)

## 2018-05-06 MED ORDER — DIPHENHYDRAMINE HCL 50 MG/ML IJ SOLN
25.0000 mg | Freq: Once | INTRAMUSCULAR | Status: AC
  Administered 2018-05-06: 25 mg via INTRAVENOUS
  Filled 2018-05-06: qty 1

## 2018-05-06 MED ORDER — VALACYCLOVIR HCL 1 G PO TABS: 1000.0000 mg | ORAL_TABLET | Freq: Three times a day (TID) | ORAL | 0 refills | Status: AC

## 2018-05-06 MED ORDER — SODIUM CHLORIDE 0.9 % IV BOLUS
1000.0000 mL | Freq: Once | INTRAVENOUS | Status: AC
  Administered 2018-05-06: 1000 mL via INTRAVENOUS

## 2018-05-06 MED ORDER — VALACYCLOVIR HCL 500 MG PO TABS
1000.0000 mg | ORAL_TABLET | Freq: Once | ORAL | Status: AC
  Administered 2018-05-06: 1000 mg via ORAL
  Filled 2018-05-06: qty 2

## 2018-05-06 MED ORDER — METOCLOPRAMIDE HCL 5 MG/ML IJ SOLN
10.0000 mg | Freq: Once | INTRAMUSCULAR | Status: AC
  Administered 2018-05-06: 14:00:00 10 mg via INTRAVENOUS
  Filled 2018-05-06: qty 2

## 2018-05-06 MED FILL — valACYclovir HCL 1 GM TABS: 1 | 7 days supply | Qty: 21 | Fill #0

## 2018-05-06 NOTE — Discharge Instructions (Signed)
Please read and follow all provided instructions.  Your diagnoses today include:  1. Mollaret's meningitis     Tests performed today include:  Blood counts electrolytes, kidney function  Vital signs. See below for your results today.   Medications prescribed:   Valtrex -medication to treat viral illness  Take any prescribed medications only as directed.  Home care instructions:  Follow any educational materials contained in this packet.  BE VERY CAREFUL not to take multiple medicines containing Tylenol (also called acetaminophen). Doing so can lead to an overdose which can damage your liver and cause liver failure and possibly death.   Follow-up instructions: Please follow-up with your primary care provider when able for further evaluation and treatment of your recurrent meningitis  Return instructions:  SEEK IMMEDIATE MEDICAL ATTENTION IF:  You have more than one episode of vomiting.   You notice dizziness or unsteadiness which is getting worse, or inability to walk.   You have convulsions or unconsciousness.   You experience severe, persistent headaches not relieved by Tylenol.  You cannot use arms or legs normally.   You have change in speech, vision, swallowing, or understanding.   Localized weakness, numbness, tingling, or change in bowel or bladder control.  You have any other emergent concerns.  Your vital signs today were: BP (!) 130/96    Pulse 81    Temp 98.4 F (36.9 C) (Oral)    Resp 20    Ht 6\' 2"  (1.88 m)    Wt 111.6 kg    SpO2 99%    BMI 31.58 kg/m  If your blood pressure (BP) was elevated above 135/85 this visit, please have this repeated by your doctor within one month. --------------

## 2018-05-06 NOTE — ED Provider Notes (Signed)
[facility] EMERGENCY DEPARTMENT Provider Note   CSN: [id number] Arrival date & time: 05/06/18  1231    History   Chief Complaint Chief Complaint  Patient presents with  . Headache    HPI Lee Anderson is a 38 y.o. male.     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  His pain started yesterday and is worse with movement.  Symptoms are similar to previous episodes of meningitis.  He did not want to wait until his symptoms got worse so he presents early in the course today.  He has not had any confusion or vomiting.  He states that he has had some chills but no documented fevers.  He has been taking ibuprofen which helps make the pain milder, however the headache then gets worse.  No numbness, tingling or weakness in the arms or the legs.  He is able to ambulate well without any difficulty.  He denies any chest pain, shortness of breath, COVID-19 exposures.  He has not noticed any skin rash and has not had any urinary problems.  Patient states that he has been on Valtrex in the past when he has gotten these symptoms and it helps.  Last hospitalization, documented in epic, 2014.  Patient had an infectious disease consult at that time.  He was discharged home on Valtrex 1 g 3 times daily.  Suppressive therapy was recommended, however patient was unable to do this due to financial reasons.     Past Medical History:  Diagnosis Date  . Bacterial meningitis     Patient Active Problem List   Diagnosis Date Noted  . Genital herpes simplex 01/25/2013  . Mollaret's meningitis 01/25/2013  . Headache(784.0) 01/25/2013    History reviewed. No pertinent surgical history.      Home Medications    Prior to Admission medications   Medication Sig Start Date End Date Taking? Authorizing Provider  albuterol (PROVENTIL HFA;VENTOLIN HFA) 108  (90 Base) MCG/ACT inhaler Inhale 1-2 puffs into the lungs every 6 (six) hours as needed for wheezing or shortness of breath. 05/05/15   Leta Baptist, MD  benzonatate (TESSALON) 100 MG capsule Take 1 capsule (100 mg total) by mouth every 8 (eight) hours. 05/05/15   Leta Baptist, MD  levofloxacin (LEVAQUIN) 750 MG tablet Take 1 tablet (750 mg total) by mouth daily. 05/05/15   Leta Baptist, MD    Family History History reviewed. No pertinent family history.  Social History Social History   Tobacco Use  . Smoking status: Current Every Day Smoker    Packs/day: 1.50    Types: Cigarettes  . Smokeless tobacco: Never Used  Substance Use Topics  . Alcohol use: Yes    Comment: occ.  . Drug use: Yes    Types: Marijuana     Allergies   Patient has no known allergies.   Review of Systems Review of Systems  Constitutional: Positive for chills. Negative for fever.  HENT: Negative for congestion, dental problem, rhinorrhea and sinus pressure.   Eyes: Negative for photophobia, discharge, redness and visual disturbance.  Respiratory: Negative for shortness of breath.   Cardiovascular: Negative for chest pain.  Gastrointestinal: Negative for nausea and vomiting.  Musculoskeletal: Negative for gait problem, neck pain and neck stiffness.  Skin: Negative for rash.  Neurological: Positive for headaches. Negative for syncope, speech difficulty, weakness, light-headedness and numbness.  Psychiatric/Behavioral: Negative for confusion.  Physical Exam Updated Vital Signs BP (!) 141/104 (BP Location: Left Arm)   Pulse 85   Temp 98.4 F (36.9 C) (Oral)   Resp 16   Ht 6\' 2"  (1.88 m)   Wt 111.6 kg   SpO2 100%   BMI 31.58 kg/m   Physical Exam Vitals signs and nursing note reviewed.  Constitutional:      Appearance: He is well-developed.  HENT:     Head: Normocephalic and atraumatic.     Right Ear: Tympanic membrane, ear canal and external ear normal.     Left Ear: Tympanic  membrane, ear canal and external ear normal.     Nose: Nose normal.     Mouth/Throat:     Pharynx: Uvula midline.  Eyes:     General: Lids are normal.     Conjunctiva/sclera: Conjunctivae normal.     Pupils: Pupils are equal, round, and reactive to light.  Neck:     Musculoskeletal: Normal range of motion and neck supple.     Meningeal: Brudzinski's sign and Kernig's sign absent.  Cardiovascular:     Rate and Rhythm: Normal rate and regular rhythm.  Pulmonary:     Effort: Pulmonary effort is normal.     Breath sounds: Normal breath sounds.  Abdominal:     Palpations: Abdomen is soft.     Tenderness: There is no abdominal tenderness.  Musculoskeletal: Normal range of motion.     Cervical back: He exhibits normal range of motion, no tenderness and no bony tenderness.  Skin:    General: Skin is warm and dry.  Neurological:     Mental Status: He is alert and oriented to person, place, and time.     GCS: GCS eye subscore is 4. GCS verbal subscore is 5. GCS motor subscore is 6.     Cranial Nerves: No cranial nerve deficit.     Sensory: No sensory deficit.     Motor: No abnormal muscle tone.     Coordination: Coordination normal.     Gait: Gait normal.     Deep Tendon Reflexes: Reflexes are normal and symmetric.      ED Treatments / Results  Labs (all labs ordered are listed, but only abnormal results are displayed) Labs Reviewed  CBC - Abnormal; Notable for the following components:      Result Value   WBC 11.4 (*)    All other components within normal limits  BASIC METABOLIC PANEL - Abnormal; Notable for the following components:   Potassium 3.4 (*)    Glucose, Bld 115 (*)    All other components within normal limits    EKG None  Radiology No results found.  Procedures Procedures (including critical care time)  Medications Ordered in ED Medications  sodium chloride 0.9 % bolus 1,000 mL (1,000 mLs Intravenous New Bag/Given 05/06/18 1413)  metoCLOPramide (REGLAN)  injection 10 mg (10 mg Intravenous Given 05/06/18 1418)  diphenhydrAMINE (BENADRYL) injection 25 mg (25 mg Intravenous Given 05/06/18 1418)  valACYclovir (VALTREX) tablet 1,000 mg (1,000 mg Oral Given 05/06/18 1410)     Initial Impression / Assessment and Plan / ED Course  I have reviewed the triage vital signs and the nursing notes.  Pertinent labs & imaging results that were available during my care of the patient were reviewed by me and considered in my medical decision making (see chart for details).        Patient seen and examined.  Extensively reviewed prior hospital admission and work-up.  Vital  signs reviewed and are as follows: BP (!) 141/104 (BP Location: Left Arm)   Pulse 85   Temp 98.4 F (36.9 C) (Oral)   Resp 16   Ht  (1.88 m)   Wt 111.6 kg   SpO2 100%   BMI 31.58 kg/m   Discussed possible ways to proceed today with patient.  Patient voluntarily states that he does not want a lumbar puncture if at all possible.  I offered full work-up including lumbar puncture with patient, however given his history I would also be comfortable with treating his symptoms here and starting him on oral antiviral medications.  Patient does not have a doctor but he seems willing to return if his symptoms change or get worse.  This includes severe headache, vomiting to the point where he cannot keep down fluids, confusion, trouble walking.  Checked Valtrex prescription online and they are between $20 and $25.  Discussed with Dr. Criss Alvine.  At this point will give migraine cocktail, check basic lab work, and hydrate.  If patient does well, anticipate discharge to home with antiviral medications and very strict return instructions.  3:18 PM patient has been doing well.  Headache is improved.  Dr. Criss Alvine has seen.  Patient ready for discharge to home at this time.  He continues to want to treat as an outpatient.  Again reiterated that if he develops worsening symptoms including vomiting,  confusion, severe headache --that he should return to the emergency department for further work-up and evaluation.  Patient verbalizes understanding and agrees with plan.  Final Clinical Impressions(s) / ED Diagnoses   Final diagnoses:  Mollaret's meningitis   Patient with headache and history of recurrent herpes meningitis.  Symptoms today are suspicious.  Patient without any red flag symptoms including fever, vomiting, confusion.  Patient does not demonstrate meningismus on exam but does complain of a posterior headache that is worse with movement.  He has a normal neurological exam.  At this point, do not strongly feel that patient requires lumbar puncture or admission.  Patient does not want to have a lumbar puncture done today.  He states that he came in the emergency department soon after onset of his symptoms for medication to help him get better sooner.  Patient seems reliable to return if symptoms worsen and we discussed strict return precautions as documented above.  ED Discharge Orders         Ordered    valACYclovir (VALTREX) 1000 MG tablet  3 times daily     05/06/18 1515           Renne Crigler, [place] 05/06/18 1520    Pricilla Loveless, MD 05/06/18 1524

## 2018-05-06 NOTE — ED Notes (Signed)
ED Provider at bedside. 

## 2018-05-06 NOTE — ED Triage Notes (Addendum)
C/o headache upon waking yesterday.  Taking ibuprofen without relief.  C/o pain to posterior hea and fever. 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  Reports taking ibuprofen 1.5 hours PTA.

## 2018-05-06 NOTE — ED Notes (Signed)
Headache since yesterday morning.  Reports his sister passed he just found out on phone while in room here.

## 2018-05-06 NOTE — ED Notes (Signed)
Pt. Feeling better and will be released home per [facility] and EDP.

## 2018-06-29 ENCOUNTER — Other Ambulatory Visit: Payer: Self-pay

## 2018-06-29 ENCOUNTER — Emergency Department (HOSPITAL_BASED_OUTPATIENT_CLINIC_OR_DEPARTMENT_OTHER): Payer: Self-pay

## 2018-06-29 ENCOUNTER — Encounter (HOSPITAL_BASED_OUTPATIENT_CLINIC_OR_DEPARTMENT_OTHER): Payer: Self-pay | Admitting: Student

## 2018-06-29 ENCOUNTER — Emergency Department (HOSPITAL_BASED_OUTPATIENT_CLINIC_OR_DEPARTMENT_OTHER)
Admission: EM | Admit: 2018-06-29 | Discharge: 2018-06-29 | Disposition: A | Discharge status: Home / Self Care | Payer: Self-pay | Attending: Emergency Medicine | Admitting: Emergency Medicine

## 2018-06-29 DIAGNOSIS — N451 Epididymitis: Secondary | ICD-10-CM | POA: Insufficient documentation

## 2018-06-29 DIAGNOSIS — F17210 Nicotine dependence, cigarettes, uncomplicated: Secondary | ICD-10-CM | POA: Insufficient documentation

## 2018-06-29 DIAGNOSIS — Z79899 Other long term (current) drug therapy: Secondary | ICD-10-CM | POA: Insufficient documentation

## 2018-06-29 LAB — URINALYSIS, ROUTINE W REFLEX MICROSCOPIC
Bilirubin Urine: NEGATIVE
Glucose, UA: NEGATIVE mg/dL
Ketones, ur: NEGATIVE mg/dL
Nitrite: NEGATIVE
Protein, ur: NEGATIVE mg/dL
Specific Gravity, Urine: >1.03 — ABNORMAL HIGH (ref 1.005–1.030)
pH: 5.5 (ref 5.0–8.0)

## 2018-06-29 LAB — URINALYSIS, MICROSCOPIC (REFLEX): WBC, UA: >50 WBC/hpf (ref 0–5)

## 2018-06-29 MED ORDER — DOXYCYCLINE HYCLATE 100 MG PO CAPS: 100.0000 mg | ORAL_CAPSULE | Freq: Two times a day (BID) | ORAL | 0 refills | Status: AC

## 2018-06-29 MED ORDER — DOXYCYCLINE HYCLATE 100 MG PO TABS
100.0000 mg | ORAL_TABLET | Freq: Once | ORAL | Status: AC
  Administered 2018-06-29: 19:00:00 100 mg via ORAL
  Filled 2018-06-29: qty 1

## 2018-06-29 MED ORDER — CEFTRIAXONE SODIUM 250 MG IJ SOLR
250.0000 mg | Freq: Once | INTRAMUSCULAR | Status: AC
  Administered 2018-06-29: 19:00:00 250 mg via INTRAMUSCULAR
  Filled 2018-06-29: qty 250

## 2018-06-29 MED ORDER — LIDOCAINE HCL (PF) 1 % IJ SOLN
2.0000 mL | Freq: Once | INTRAMUSCULAR | Status: AC
  Administered 2018-06-29: 2 mL
  Filled 2018-06-29: qty 5

## 2018-06-29 MED ORDER — NAPROXEN 500 MG PO TABS: 500.0000 mg | ORAL_TABLET | Freq: Two times a day (BID) | ORAL | 0 refills | Status: AC

## 2018-06-29 MED ORDER — OXYCODONE-ACETAMINOPHEN 5-325 MG PO TABS
1.0000 | ORAL_TABLET | Freq: Once | ORAL | Status: AC
  Administered 2018-06-29: 18:00:00 1 via ORAL
  Filled 2018-06-29: qty 1

## 2018-06-29 NOTE — Discharge Instructions (Signed)
You are seen in the emergency department today for testicular pain and swelling.  Your ultrasound and urine showed that you have findings consistent with epididymitis, please see the attached handout for further information regarding this diagnosis.  We are treating this condition with antibiotics- doxycycline- please take this as prescribed.  We also send you home with naproxen to help with pain and swelling. - Naproxen is a nonsteroidal anti-inflammatory medication that will help with pain and swelling. Be sure to take this medication as prescribed with food, 1 pill every 12 hours,  It should be taken with food, as it can cause stomach upset, and more seriously, stomach bleeding. Do not take other nonsteroidal anti-inflammatory medications with this such as Advil, Motrin, Aleve, Mobic, Goodie Powder, or Motrin.    You make take Tylenol per over the counter dosing with these medications.   We have prescribed you new medication(s) today. Discuss the medications prescribed today with your pharmacist as they can have adverse effects and interactions with your other medicines including over the counter and prescribed medications. Seek medical evaluation if you start to experience new or abnormal symptoms after taking one of these medicines, seek care immediately if you start to experience difficulty breathing, feeling of your throat closing, facial swelling, or rash as these could be indications of a more serious allergic reaction   Follow-up with urology within 3 to 5 days.  Return to the ER for new or worsening symptoms including but not limited to worsening pain/swelling, fever, vomiting, pain with bowel movements, inability to keep fluids down, abdominal pain, or any other concerns.

## 2018-06-29 NOTE — ED Triage Notes (Signed)
Pt reports he has had left testicle swelling and pain since Thursday. 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 Worse over the last day

## 2018-06-29 NOTE — ED Notes (Signed)
Taken to US.

## 2018-06-29 NOTE — ED Provider Notes (Signed)
[facility] EMERGENCY DEPARTMENT Provider Note   CSN: [id number] Arrival date & time: 06/29/18  1700  History   Chief Complaint Chief Complaint  Patient presents with  . Testicle Pain   HPI Lee Anderson is a 38 y.o. male with a hx of tobacco abuse, prior meningitis, & genital herpes who presents to the ED with complaints of L sided testicular pain/swelling x 3 days. Sxs are constant, progressively worsening, current pain is an 8/10 in severity, worse with movement, better with lying down. Tried ibuprofen without much change. He has noted some mild dysuria. States that 1 week ago he thought he might have had a UTI took AZO for several days and this resolved, but then urinary sxs returned w/ testicular sxs. Denies fever, chills, nausea, vomiting, rectal pain, pain w/ bowel movements, abdominal pain, flank pain, or penile discharge. Patient is sexually active in a monogamous relationship, has had same partner for several years, no concern for STD.      HPI  Past Medical History:  Diagnosis Date  . Bacterial meningitis     Patient Active Problem List   Diagnosis Date Noted  . Genital herpes simplex 01/25/2013  . Mollaret's meningitis 01/25/2013  . Headache(784.0) 01/25/2013    No past surgical history on file.      Home Medications    Prior to Admission medications   Medication Sig Start Date End Date Taking? Authorizing Provider  albuterol (PROVENTIL HFA;VENTOLIN HFA) 108 (90 Base) MCG/ACT inhaler Inhale 1-2 puffs into the lungs every 6 (six) hours as needed for wheezing or shortness of breath. 05/05/15   Leta Baptist, MD  valACYclovir (VALTREX) 1000 MG tablet Take 1 tablet (1,000 mg total) by mouth 3 (three) times daily. 05/06/18   Renne Crigler, PA-C    Family History No family history on file.  Social History Social History   Tobacco Use  . Smoking status: Current Every Day Smoker    Packs/day: 1.50    Types: Cigarettes  . Smokeless tobacco: Never  Used  Substance Use Topics  . Alcohol use: Yes    Comment: occ.  . Drug use: Yes    Types: Marijuana     Allergies   Patient has no known allergies.   Review of Systems Review of Systems  Constitutional: Negative for chills and fever.  Respiratory: Negative for shortness of breath.   Cardiovascular: Negative for chest pain.  Gastrointestinal: Negative for abdominal pain, constipation, diarrhea, nausea, rectal pain and vomiting.  Genitourinary: Positive for dysuria, scrotal swelling and testicular pain. Negative for discharge, flank pain and hematuria.  All other systems reviewed and are negative.    Physical Exam Updated Vital Signs BP (!) 152/115 (BP Location: Right Arm)   Pulse 90   Temp 98.5 F (36.9 C) (Oral)   Resp 18   Ht  (1.88 m)   Wt 108.9 kg   SpO2 99%   BMI 30.81 kg/m   Physical Exam Vitals signs and nursing note reviewed. Exam conducted with a chaperone present.  Constitutional:      General: He is not in acute distress.    Appearance: He is well-developed. He is not toxic-appearing.  HENT:     Head: Normocephalic and atraumatic.  Eyes:     General:        Right eye: No discharge.        Left eye: No discharge.     Conjunctiva/sclera: Conjunctivae normal.  Neck:     Musculoskeletal: Neck supple.  Cardiovascular:     Rate and Rhythm: Normal rate and regular rhythm.  Pulmonary:     Effort: Pulmonary effort is normal. No respiratory distress.     Breath sounds: Normal breath sounds. No wheezing, rhonchi or rales.  Abdominal:     General: There is no distension.     Palpations: Abdomen is soft.     Tenderness: There is no abdominal tenderness. There is no guarding or rebound.     Hernia: There is no hernia in the right inguinal area or left inguinal area.  Genitourinary:    Penis: Circumcised. No erythema, discharge or lesions.      Scrotum/Testes: Cremasteric reflex is present.        Right: Mass, tenderness, swelling or varicocele not  present. Right testis is descended.        Left: Tenderness and swelling present. Mass or varicocele not present. Left testis is descended.     Epididymis:     Right: No tenderness.     Left: Enlarged. Tenderness present.     Comments: RN Joss Benton present as chaperone.  Lymphadenopathy:     Lower Body: No right inguinal adenopathy. No left inguinal adenopathy.  Skin:    General: Skin is warm and dry.     Findings: No rash.  Neurological:     Mental Status: He is alert.     Comments: Clear speech.   Psychiatric:        Behavior: Behavior normal.    ED Treatments / Results  Labs (all labs ordered are listed, but only abnormal results are displayed) Labs Reviewed  URINALYSIS, ROUTINE W REFLEX MICROSCOPIC - Abnormal; Notable for the following components:      Result Value   APPearance CLOUDY (*)    Specific Gravity, Urine >1.030 (*)    Hgb urine dipstick TRACE (*)    Leukocytes,Ua LARGE (*)    All other components within normal limits  URINALYSIS, MICROSCOPIC (REFLEX) - Abnormal; Notable for the following components:   Bacteria, UA RARE (*)    All other components within normal limits  URINE CULTURE  GC/CHLAMYDIA PROBE AMP ([place]) NOT AT [facility]    EKG None  Radiology US Scrotum W/doppler  Result Date: 06/29/2018 CLINICAL DATA:  Left testicular pain, swelling EXAM: SCROTAL ULTRASOUND DOPPLER ULTRASOUND OF THE TESTICLES TECHNIQUE: Complete ultrasound examination of the testicles, epididymis, and other scrotal structures was performed. Color and spectral Doppler ultrasound were also utilized to evaluate blood flow to the testicles. COMPARISON:  None. FINDINGS: Right testicle Measurements: 5.2 x 2.6 x 2.9 cm. No mass or microlithiasis visualized. Left testicle Measurements: 5.2 x 3.1 x 3.4 cm. No mass or microlithiasis visualized. Right epididymis:  Normal in size and appearance. Left epididymis: Enlarged, hypervascular compatible with epididymitis Hydrocele:  Mild left-side  hydrocele Varicocele:  None visualized. Pulsed Doppler interrogation of both testes demonstrates normal low resistance arterial and venous waveforms bilaterally. IMPRESSION: Enlarged, hyperemic left epididymis compatible with epididymitis. No testicular abnormality. Electronically Signed   By: Charlett Nose M.D.   On: 06/29/2018 18:27    Procedures Procedures (including critical care time)  Medications Ordered in ED Medications - No data to display   Initial Impression / Assessment and Plan / ED Course  I have reviewed the triage vital signs and the nursing notes.  Pertinent labs & imaging results that were available during my care of the patient were reviewed by me and considered in my medical decision making (see chart for details).   Patient  presents to the ED w/ L testicular pain/swelling & urinary sxs. Nontoxic appearing, vitals WNL with the exception of elevated BP doubt HTN emergency. Exam w/ L epididymis & testicular tenderness & swelling. No penile discharge. No palpable hernia. No abdominal tenderness/peritoneal signs. No pain w/ bowel movements to suggest prostatitis. Pain is aching and constant seems less likely to be nephrolithiasis. Plan for US & UA to further assess.   US w/ epididymitis, no torsion, UA consistent w/ infection. Patient without concern for STD. Discussed findings & plan of care w/ supervising physician Dr. Silverio LayYao- recommends IM Rocephin in the ER & discharge home with doxycycline 100 mg BID for 10 days for abx selection. Urine culture sent to ensure proper abx selection. Naproxen for pain/swelling. Follow up with urology. I discussed results, treatment plan, need for follow-up, and return precautions with the patient. Provided opportunity for questions, patient confirmed understanding and is in agreement with plan.   Final Clinical Impressions(s) / ED Diagnoses   Final diagnoses:  Epididymitis    ED Discharge Orders         Ordered    doxycycline (VIBRAMYCIN) 100  MG capsule  2 times daily     06/29/18 1911    naproxen (NAPROSYN) 500 MG tablet  2 times daily     06/29/18 1911           Desmond Lopeetrucelli, Donzel Romack R, PA-C 06/29/18 1915    Charlynne PanderYao, David Hsienta, MD 06/29/18 2230

## 2018-07-01 LAB — URINE CULTURE: Culture: NO GROWTH

## 2018-07-01 LAB — GC/CHLAMYDIA PROBE AMP (~~LOC~~) NOT AT ARMC
Chlamydia: NEGATIVE
Neisseria Gonorrhea: POSITIVE — AB

## 2019-10-21 IMAGING — US ULTRASOUND SCROTUM DOPPLER COMPLETE
1 series · 14 of 25 positions shown · non-contrast
Comparison: None.

CLINICAL DATA: Left testicular pain, swelling

EXAM:
SCROTAL ULTRASOUND
DOPPLER ULTRASOUND OF THE TESTICLES
TECHNIQUE: Complete ultrasound examination of the testicles, epididymis, and
other scrotal structures was performed. Color and spectral Doppler
ultrasound were also utilized to evaluate blood flow to the
testicles.

[Series 1: ultrasound scrotum doppler complete · 14 of 52 slices shown]
[im 1/52]
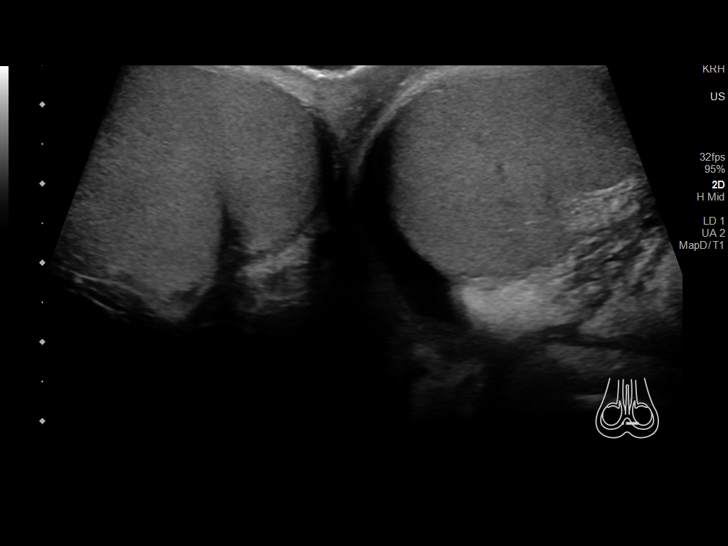
[im 5/52]
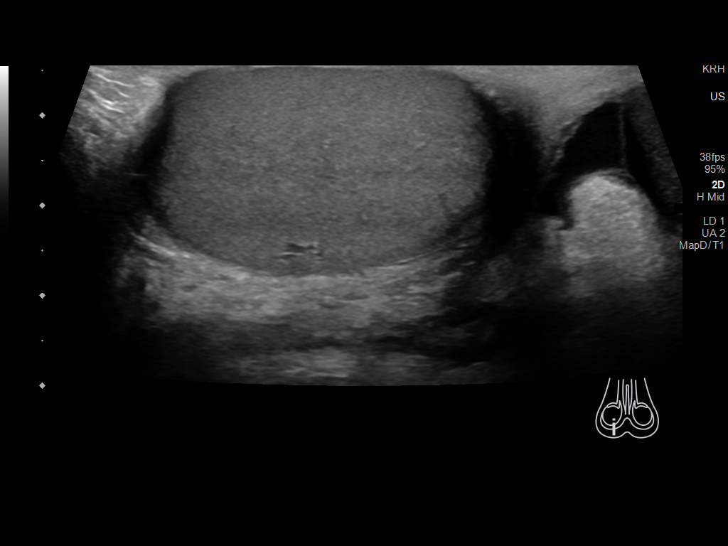
[im 9/52]
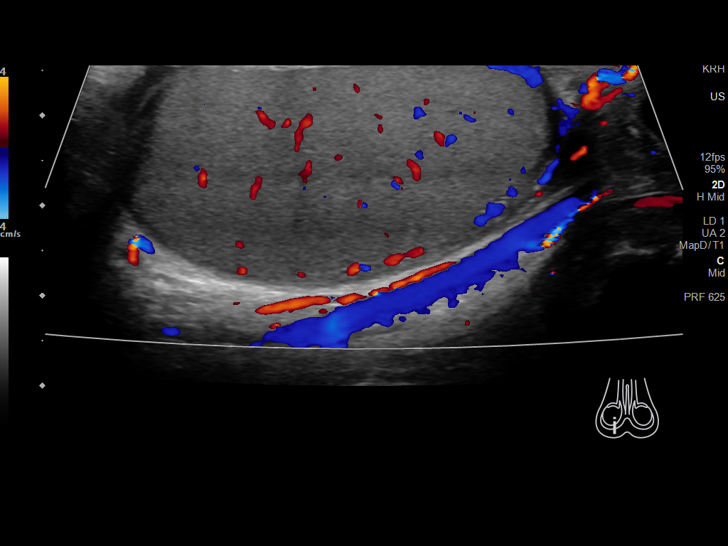
[im 13/52]
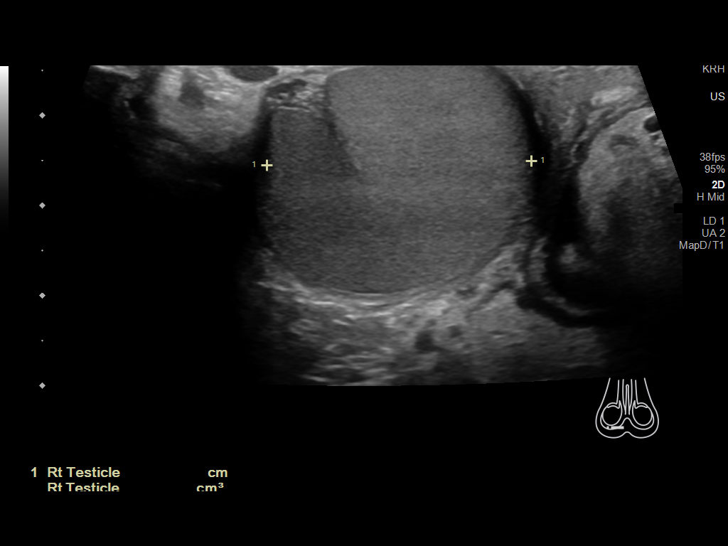
[im 18/52]
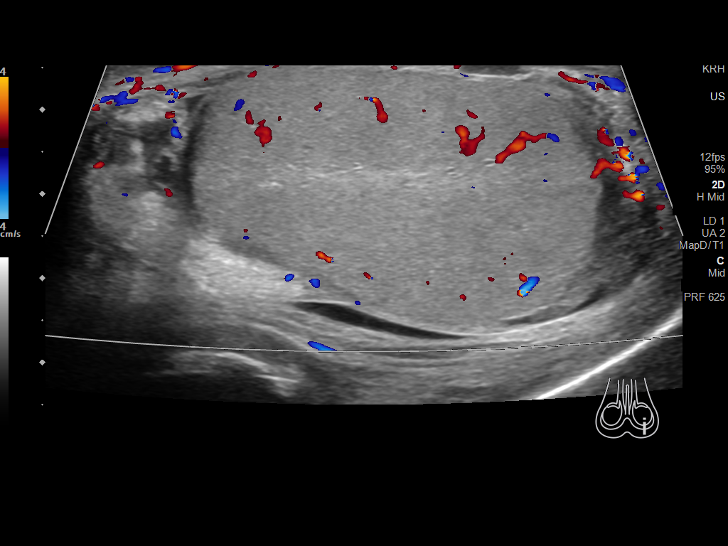
[im 20/52]
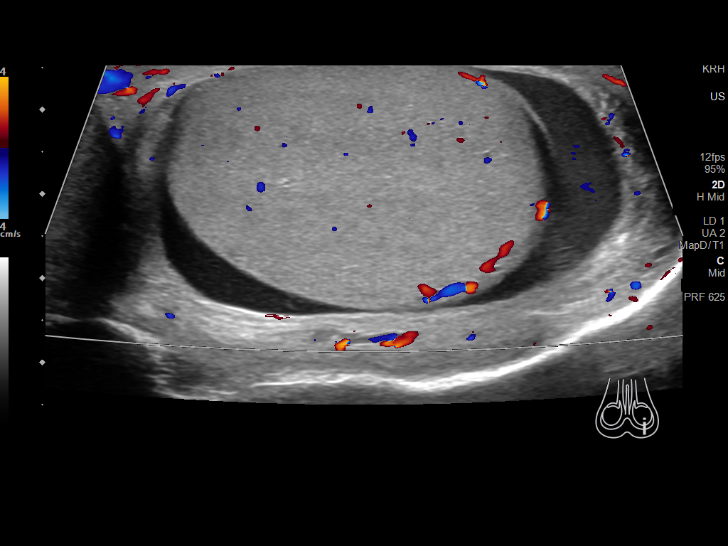
[im 24/52]
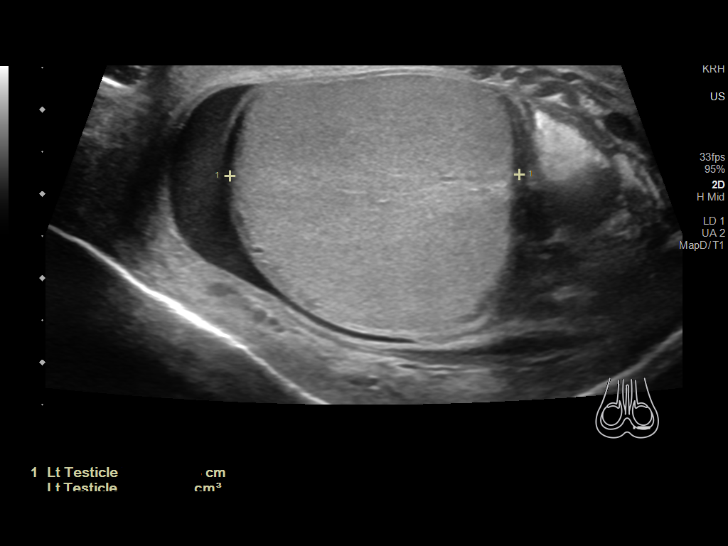
[im 28/52]
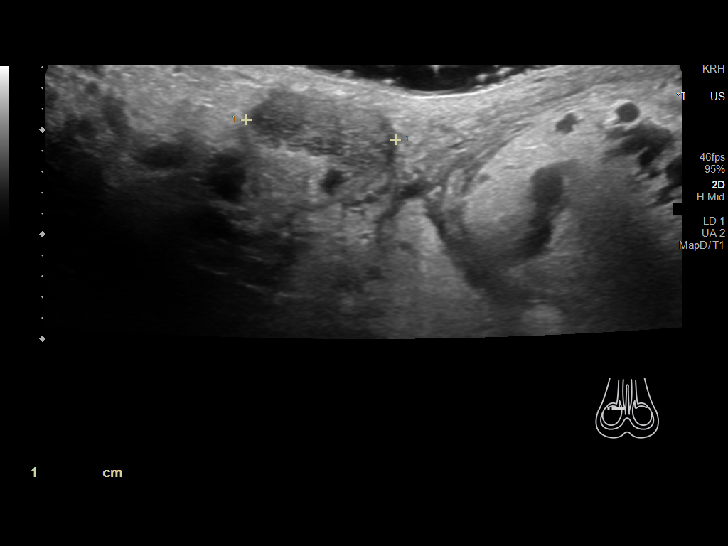
[im 32/52]
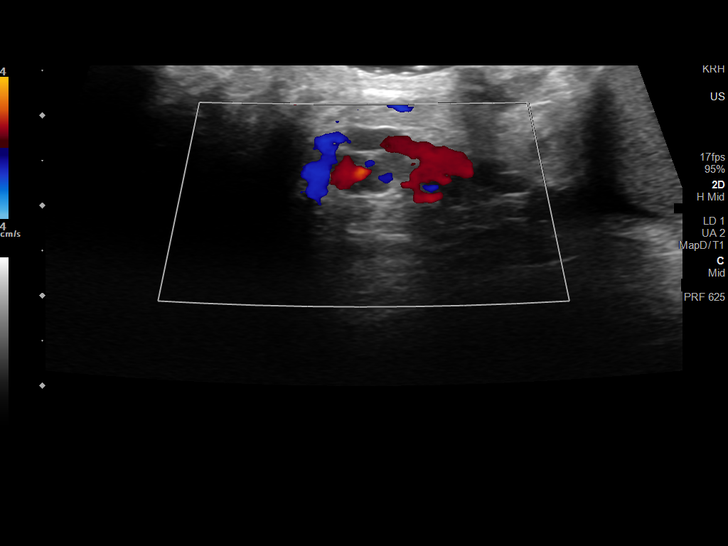
[im 35/52]
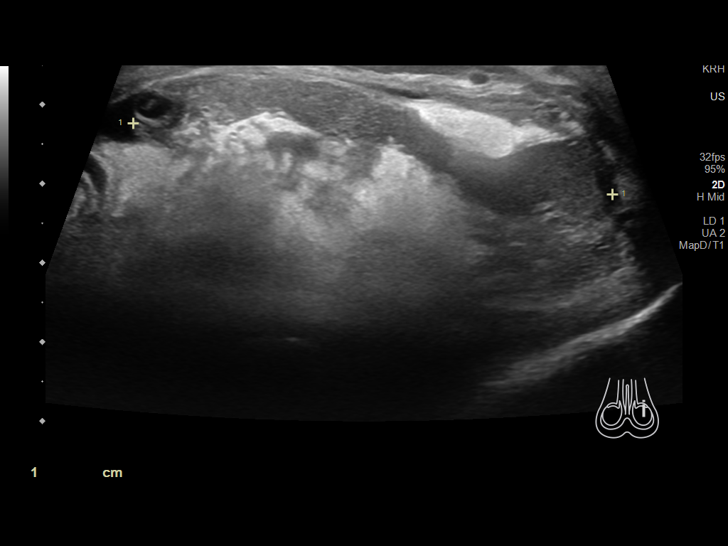
[im 39/52]
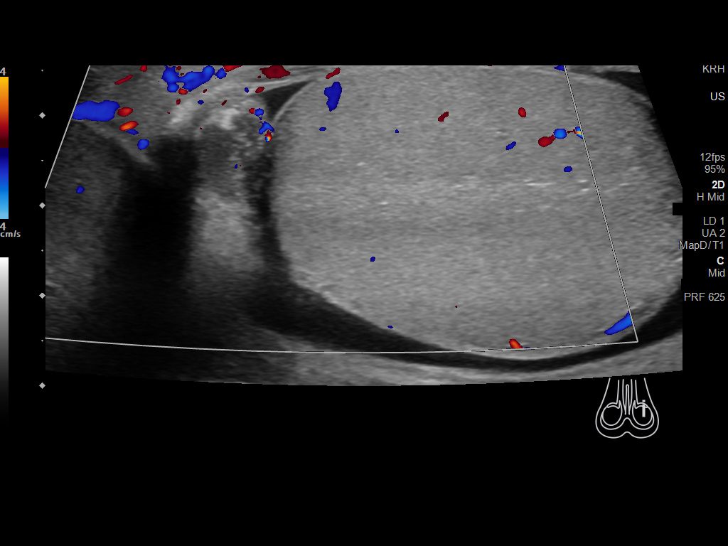
[im 43/52]
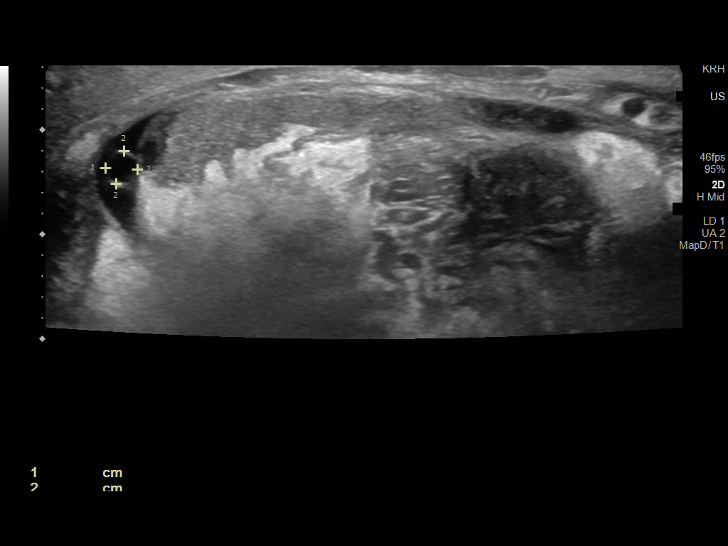
[im 47/52]
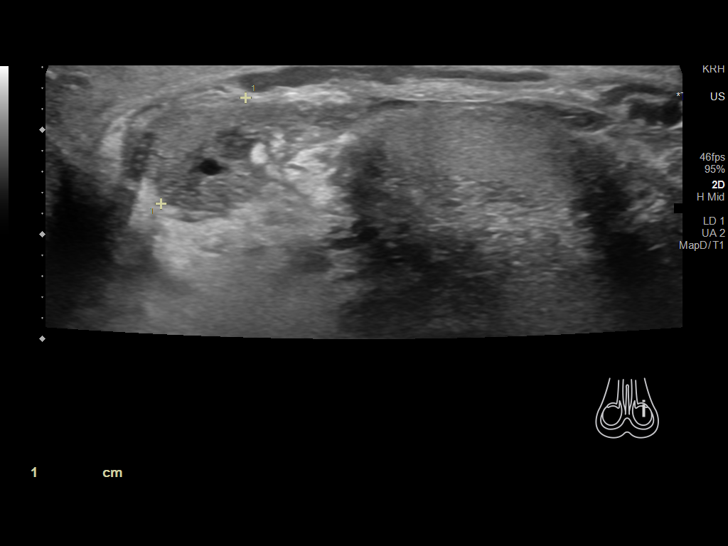
[im 52/52]
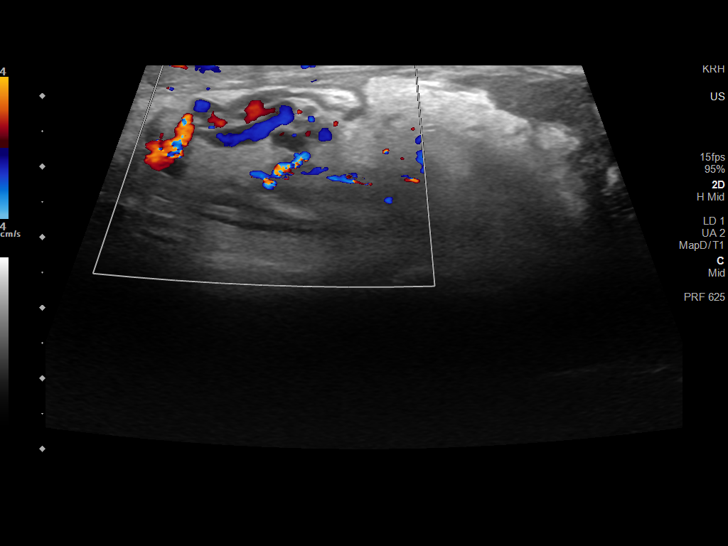

[14 of 25 positions shown; findings below may reference images not displayed]

FINDINGS: Right testicle

Measurements: 5.2 x 2.6 x 2.9 cm. No mass or microlithiasis
visualized.

Left testicle

Measurements: 5.2 x 3.1 x 3.4 cm. No mass or microlithiasis
visualized.

Right epididymis:  Normal in size and appearance.

Left epididymis: Enlarged, hypervascular compatible with
epididymitis

Hydrocele:  Mild left-side hydrocele

Varicocele:  None visualized.

Pulsed Doppler interrogation of both testes demonstrates normal low
resistance arterial and venous waveforms bilaterally.
IMPRESSION: Enlarged, hyperemic left epididymis compatible with epididymitis.

No testicular abnormality.
